# Patient Record
Sex: Male | Born: 1975 | Race: White | Hispanic: No | Marital: Married | Smoking: Never smoker
Health system: Southern US, Community
[De-identification: ages and names within clinical notes are randomized; demographics above are authoritative.]

## PROBLEM LIST (undated history)

## (undated) DIAGNOSIS — R7303 Prediabetes: Secondary | ICD-10-CM

## (undated) DIAGNOSIS — I4901 Ventricular fibrillation: Secondary | ICD-10-CM

## (undated) DIAGNOSIS — I469 Cardiac arrest, cause unspecified: Secondary | ICD-10-CM

## (undated) DIAGNOSIS — Z950 Presence of cardiac pacemaker: Secondary | ICD-10-CM

## (undated) DIAGNOSIS — J302 Other seasonal allergic rhinitis: Secondary | ICD-10-CM

## (undated) HISTORY — DX: Ventricular fibrillation: I49.01

## (undated) HISTORY — DX: Cardiac arrest, cause unspecified: I46.9

## (undated) HISTORY — DX: Other seasonal allergic rhinitis: J30.2

## (undated) HISTORY — PX: CARDIAC DEFIBRILLATOR PLACEMENT: SHX171

## (undated) HISTORY — DX: Prediabetes: R73.03

---

## 2007-09-18 ENCOUNTER — Ambulatory Visit: Payer: Self-pay | Admitting: Internal Medicine

## 2007-09-18 ENCOUNTER — Ambulatory Visit: Payer: Self-pay | Admitting: Cardiology

## 2007-09-18 ENCOUNTER — Inpatient Hospital Stay (HOSPITAL_COMMUNITY): Admission: EM | Admit: 2007-09-18 | Discharge: 2007-09-23 | Payer: Self-pay | Admitting: Emergency Medicine

## 2007-09-18 ENCOUNTER — Encounter: Payer: Self-pay | Admitting: Cardiology

## 2007-10-03 ENCOUNTER — Ambulatory Visit: Payer: Self-pay | Admitting: Internal Medicine

## 2008-01-26 ENCOUNTER — Ambulatory Visit: Payer: Self-pay | Admitting: Internal Medicine

## 2008-06-27 ENCOUNTER — Encounter: Payer: Self-pay | Admitting: Internal Medicine

## 2008-09-21 ENCOUNTER — Telehealth: Payer: Self-pay | Admitting: Internal Medicine

## 2008-10-06 DIAGNOSIS — J301 Allergic rhinitis due to pollen: Secondary | ICD-10-CM

## 2008-10-06 DIAGNOSIS — Z95810 Presence of automatic (implantable) cardiac defibrillator: Secondary | ICD-10-CM

## 2008-10-08 ENCOUNTER — Ambulatory Visit: Payer: Self-pay | Admitting: Internal Medicine

## 2008-10-31 ENCOUNTER — Telehealth: Payer: Self-pay | Admitting: Internal Medicine

## 2009-01-28 ENCOUNTER — Encounter (INDEPENDENT_AMBULATORY_CARE_PROVIDER_SITE_OTHER): Payer: Self-pay | Admitting: *Deleted

## 2009-08-16 ENCOUNTER — Ambulatory Visit: Payer: Self-pay | Admitting: Internal Medicine

## 2010-04-21 ENCOUNTER — Telehealth: Payer: Self-pay | Admitting: Internal Medicine

## 2010-05-20 ENCOUNTER — Encounter (INDEPENDENT_AMBULATORY_CARE_PROVIDER_SITE_OTHER): Payer: Self-pay | Admitting: *Deleted

## 2010-06-17 NOTE — Progress Notes (Signed)
Summary: refill  Phone Note Refill Request Message from:  Patient on April 21, 2010 1:09 PM  Refills Requested: Medication #1:  METOPROLOL TARTRATE 25 MG TABS 1 TAB TWICE DAILY [facility] [address]  Initial call taken by: Judie Grieve,  April 21, 2010 1:10 PM    Prescriptions: METOPROLOL TARTRATE 25 MG TABS (METOPROLOL TARTRATE) 1 TAB TWICE DAILY  #60 Each x 3   Entered by:   Laurance Flatten CMA   Authorized by:   Laren Boom, MD, [facility]   Signed by:   Laurance Flatten CMA on 04/22/2010   Method used:   Electronically to        [facility] [address] (retail)       [phone]       [place], [place]  [address]       Ph: [phone]       Fax: [phone]   RxID:   [id number]

## 2010-06-17 NOTE — Assessment & Plan Note (Signed)
Summary: ROV/ GD   Visit Type:  rov Primary Provider:  none  CC:  no heart complaints.  History of Present Illness: Mr. Roughton returns today for followup.  He is a 35 yo man with a rescucitated VF arrest, s/p ICD implantation who returns today for eval.  He has been stable.  He is exercising regularly with heart rates over 180/min.  He denies anginal symptoms, sob, or peripheral edema.  He's had no intercurrent ICD therapies. He does note some dizzy spells but has increased his caffiene intake.  Problems Prior to Update: 1)  Ventricular Fibrillation Arrest,hx of  (ICD-427.41) 2)  Allergic Rhinitis, Seasonal  (ICD-477.0) 3)  Implantation of Defibrillator, Hx of  (ICD-V45.02)  Current Medications (verified): 1)  Metoprolol Tartrate 25 Mg Tabs (Metoprolol Tartrate) .Marland Kitchen.. 1 Tab Twice Daily 2)  Fish Oil 1000 Mg Caps (Omega-3 Fatty Acids) .... 2 Caps Once Daily 3)  Aspirin 81 Mg Tabs (Aspirin) .... Take One Tablet By Mouth Once Daily 4)  Multivitamins  Tabs (Multiple Vitamin) .... Take By Mouth Once Daily  Allergies (verified): No Known Drug Allergies  Past History:  Past Medical History: Last updated: 10/06/2008 VENTRICULAR FIBRILLATION ARREST,HX OF (ICD-427.41) ALLERGIC RHINITIS, SEASONAL (ICD-477.0)     Past Surgical History: Last updated: 10/06/2008 IMPLANTATION OF DEFIBRILLATOR, HX OF (ICD-V45.02)  Review of Systems  The patient denies chest pain, syncope, dyspnea on exertion, and peripheral edema.    Vital Signs:  Patient profile:   35 year old male Height:      70 inches Weight:      177 pounds BMI:     25.49 Pulse rate:   59 / minute BP sitting:   121 / 75  (left arm) Cuff size:   large  Vitals Entered By: Oswald Hillock (August 16, 2009 8:54 AM)  Physical Exam  General:  Well developed, well nourished, in no acute distress. Head:  normocephalic and atraumatic Eyes:  PERRLA/EOM intact; conjunctiva and lids normal. Mouth:  Teeth, gums and palate  normal. Oral mucosa normal. Neck:  Neck supple, no JVD. No masses, thyromegaly or abnormal cervical nodes. Chest Wall:  Well healed ICD incision. Lungs:  Clear bilaterally to auscultation with no wheezes, rales, or rhonchi. Heart:  RRR with normal S1 and S2.  PMI is not enlarged or laterally displaced. Abdomen:  Bowel sounds positive; abdomen soft and non-tender without masses, organomegaly, or hernias noted. No hepatosplenomegaly. Msk:  Back normal, normal gait. Muscle strength and tone normal. Pulses:  pulses normal in all 4 extremities Extremities:  No clubbing or cyanosis.  Multiple tatoos. Neurologic:  Alert and oriented x 3.   EKG  Procedure date:  08/16/2009  Findings:      Sinus bradycardia with rate of:  59.   ICD Specifications Following MD:  Lewayne Bunting, MD     ICD Vendor:  [facility] Jude     ICD Model Number:  [id number]     ICD Serial Number:  [id number] ICD DOI:  09/22/2007     ICD Implanting MD:  Lewayne Bunting, MD  Lead 1:    Location: RV     DOI: 09/22/2007     Model #: 1308     Serial #: [id number]     Status: active  Indications::  CARDIAC ARREST   ICD Follow Up Remote Check?  No Battery Voltage:  3.20 V     Charge Time:  11.1 seconds     Battery Est. Longevity:  7.5 years Underlying rhythm:  SR  ICD Dependent:  No       ICD Device Measurements Right Ventricle:  Amplitude: 10.4 mV, Impedance: 410 ohms, Threshold: 1.25 V at 0.5 msec Shock Impedance: 79 ohms   Episodes Coumadin:  No Shock:  0     ATP:  0     Nonsustained:  0     Ventricular Pacing:  <1%  Brady Parameters Mode VVI     Lower Rate Limit:  40      Tachy Zones VF:  200     Next Cardiology Appt Due:  11/15/2009 Tech Comments:  RV output 2.5@0 .5.  There were 3 noise reversions noted all on the same date in July and with 1/2 hour time frame.  This just happened to be Mr. Erion anniversary and him and his wife were swimming in an indoor pool area with their son.  There was only noise noted no oversensing.   Device function normal.  He will be seen back in the clinic in 3 months. Altha Harm, LPN  August 17, 7423 9:15 AM   Impression & Recommendations:  Problem # 1:  VENTRICULAR FIBRILLATION ARREST,HX OF (ICD-427.41) His arrhythmias have been well controlled.  Will followup in one year. His updated medication list for this problem includes:    Metoprolol Tartrate 25 Mg Tabs (Metoprolol tartrate) .Marland Kitchen... 1 tab twice daily    Aspirin 81 Mg Tabs (Aspirin) .Marland Kitchen... Take one tablet by mouth once daily  Problem # 2:  IMPLANTATION OF DEFIBRILLATOR, HX OF (ICD-V45.02) His device is working normally today except for three noise reversions, all on the same day.  Patient Instructions: 1)  Your physician wants you to follow-up in: 12 months with Dr Ladona Ridgel.  You will receive a reminder letter in the mail two months in advance. If you don't receive a letter, please call our office to schedule the follow-up appointment. 2)  Your physician recommends that you schedule a follow-up appointment in: 3 months with device clinic.

## 2010-06-17 NOTE — Cardiovascular Report (Signed)
Summary: Office Visit   Office Visit   Imported By: Roderic Ovens 08/23/2009 14:01:57  _____________________________________________________________________  External Attachment:    Type:   Image     Comment:   External Document

## 2010-06-19 NOTE — Letter (Signed)
Summary: Device-Delinquent Check  [facility], Main Office  [address]   [place], [place] [address]   Phone: [phone]  Fax: [phone]     May 20, 2010 MRN: [id number]   [facility] [address] [place], [place]  [address]   Dear Mr. Matsumura,  According to our records, you have not had your implanted device checked in the recommended period of time.  We are unable to determine appropriate device function without checking your device on a regular basis.  Please call our office to schedule an appointment, with Dr Ladona Ridgel, as soon as possible.  If you are having your device checked by another physician, please call us so that we may update our records.  Thank you,  Letta Moynahan, EMT  May 20, 2010 1:01 PM  [facility]

## 2010-07-29 ENCOUNTER — Encounter: Payer: Self-pay | Admitting: *Deleted

## 2010-08-05 NOTE — Letter (Signed)
Summary: Device-Delinquent Phone Journalist, newspaper, Main Office  [address]   [place], [place] [address]   Phone: [phone]  Fax: [phone]     July 29, 2010 MRN: [id number]   [facility] [address] [place], [place]  [address]   Dear Brian Arellano,  According to our records, you were scheduled for a device phone transmission on 07-24-2010 .     We did not receive any results from this check.  If you transmitted on your scheduled day, please call us to help troubleshoot your system.  If you forgot to send your transmission, please send one upon receipt of this letter.  Thank you,   Architectural technologist Device Clinic

## 2010-08-12 ENCOUNTER — Encounter: Payer: Self-pay | Admitting: Internal Medicine

## 2010-08-21 ENCOUNTER — Ambulatory Visit (INDEPENDENT_AMBULATORY_CARE_PROVIDER_SITE_OTHER): Payer: PRIVATE HEALTH INSURANCE | Admitting: Internal Medicine

## 2010-08-21 ENCOUNTER — Encounter: Payer: Self-pay | Admitting: Internal Medicine

## 2010-08-21 DIAGNOSIS — Z95810 Presence of automatic (implantable) cardiac defibrillator: Secondary | ICD-10-CM

## 2010-08-21 DIAGNOSIS — I469 Cardiac arrest, cause unspecified: Secondary | ICD-10-CM

## 2010-08-21 NOTE — Assessment & Plan Note (Signed)
His device is working normally. At her longevity is approximately 7 years. He has had no recurrent arrhythmias. We'll recheck his device in several months.

## 2010-08-21 NOTE — Progress Notes (Signed)
HPI  Brian Arellano returns today for followup. He is a very well-appearing young man who is status post cardiac arrest. He underwent implantable defibrillator 3 years ago. It was thought that he had long QT syndrome. The patient continues to exercise regularly. He has had no recurrent defibrillator discharges. He denies chest pain or shortness of breath. He has had no trouble with his medications.  No Known Allergies   Current Outpatient Prescriptions  Medication Sig Dispense Refill  . aspirin 81 MG tablet Take 81 mg by mouth daily.        . fish oil-omega-3 fatty acids 1000 MG capsule Take 2 g by mouth daily.        . metoprolol tartrate (LOPRESSOR) 25 MG tablet Take 25 mg by mouth 2 (two) times daily.        . Multiple Vitamin (MULTI-VITAMIN PO) Take by mouth daily.           Past Medical History  Diagnosis Date  . Cardiac arrest - ventricular fibrillation   . Allergic rhinitis, seasonal     ROS:   All systems reviewed and negative except as noted in the HPI.   Past Surgical History  Procedure Date  . Cardiac defibrillator placement     ICD [employer] Jude     Family History  Problem Relation Age of Onset  . Coronary artery disease Father     cabg x2  . Heart disease      father's brother, bypass surgery  . Heart disease      father's brother, bypass surgery  . Coronary artery disease      family history     History   Social History  . Marital Status: Married    Spouse Name: N/A    Number of Children: N/A  . Years of Education: N/A   Occupational History  . Not on file.   Social History Main Topics  . Smoking status: Never Smoker   . Smokeless tobacco: Not on file  . Alcohol Use: Not on file  . Drug Use: Not on file  . Sexually Active: Not on file   Other Topics Concern  . Not on file   Social History Narrative  . No narrative on file     BP 120/80  Pulse 81  Ht 5\' 10"  (1.778 m)  Wt 184 lb (83.462 kg)  BMI 26.40 kg/m2  Physical Exam:  Well  appearing NAD HEENT: Unremarkable Neck:  No JVD, no thyromegally Lymphatics:  No adenopathy Back:  No CVA tenderness Lungs:  Clear. Well healed ICD incision. HEART:  Regular rate rhythm, no murmurs, no rubs, no clicks Abd:  Flat, positive bowel sounds, no organomegally, no rebound, no guarding Ext:  2 plus pulses, no edema, no cyanosis, no clubbing Skin:  No rashes no nodules Neuro:  CN II through XII intact, motor grossly intact  DEVICE  Normal device function.  See PaceArt for details.   Assess/Plan:

## 2010-08-21 NOTE — Patient Instructions (Addendum)
Your physician recommends that you schedule a follow-up appointment in: 3 months in device clinic  Your physician wants you to follow-up in: 12 months with Dr Court Joy will receive a reminder letter in the mail two months in advance. If you don't receive a letter, please call our office to schedule the follow-up appointment.

## 2010-09-30 NOTE — Cardiovascular Report (Signed)
Brian Arellano, Brian Arellano        ACCOUNT NO.:  [id number]   MEDICAL RECORD NO.:  [id number]          PATIENT TYPE:  INP   LOCATION:  2924                         FACILITY:  [facility]   PHYSICIAN:  Arturo Morton. Riley Kill, MD, FACCDATE OF BIRTH:  1976-04-17   DATE OF PROCEDURE:  DATE OF DISCHARGE:                            CARDIAC CATHETERIZATION   INDICATIONS:  The patient is a 35 year old who had suffered a cardiac  arrest.  He has no known prior coronary artery disease.  He has a strong  family history, but has very few risk factors other than that.  He was  cooled, and has recovered mental function.  The current study was done  to assess coronary anatomy as a pre-procedure movement to implantable  defibrillator insertion.   PROCEDURE:  1. Left heart catheterization  2. Selective coronary arteriography .  3. Selective left ventriculography.   DESCRIPTION OF PROCEDURE:  The patient was brought to the  catheterization laboratory and prepped and draped in usual fashion.  Through an anterior puncture, the femoral artery was entered.  A 5-  French sheath was placed.  Views of left and right coronary arteries  were then obtained in multiple angiographic projections.  I did not feel  it.  We saw the left anterior descending artery well enough, so the 5-  [place] sheath was upgraded to a 6-French sheath a JL3.5 guiding catheter  was then utilized to get maximum contrast into the coronary.  Following  this, central aortic and left ventricular pressures were measured with a  pigtail and ventriculography was done in the RAO projection.  Overall,  the patient tolerated procedure well and there were no complications, I  reviewed the findings with his wife.   HEMODYNAMIC DATA:  1. Central aortic pressure 116/67.  2. Left ventricular pressure 126/9.  3. There is no gradient pullback across aortic valve.   ANGIOGRAPHIC DATA:  1. Left main is free of critical disease.  2. The LAD courses to the  apex.  It divides distally into a large      diagonal and an apical LAD.  There is a smaller diagonal more      proximally.  After the first diagonal, there is an area of mild      eccentric plaquing of approximately 30% involving the LAD.  It does      not appear to be critical and does not appear to be flow-limiting.  3. The ramus intermedius is free of critical disease.  4. The AV circumflex is smooth and free of critical disease.  5. The right coronary artery is a moderately large vessel providing a      posterior descending and posterolateral system is free of critical      disease.  6. The ventriculogram demonstrates well preserved overall LV function      because the tachycardia was difficult to gauge overall EF but I      would grade it as 50%.   CONCLUSION:  1. Preserved overall left ventricular function.  2. Minor eccentric plaquing in the LAD.   DISPOSITION:  Electrophysiology will further evaluate the patient for  possible ICD placement.  He may also need MRI.      Arturo Morton. Riley Kill, MD, [facility]  Electronically Signed     TDS/MEDQ  D:  09/21/2007  T:  09/22/2007  Job:  [id number]

## 2010-09-30 NOTE — Assessment & Plan Note (Signed)
[facility]                         ELECTROPHYSIOLOGY OFFICE NOTE   NAME:Brian Arellano, Brian Arellano               MRN:          [id number]  DATE:01/26/2008                            DOB:          August 02, 1975    Brian Arellano returns today for followup.  He is a very pleasant young  man with a history of VF arrest status post successful resuscitation.  There was a question though he was not clearly diagnostic of long QT  syndrome as he had very prominent U waves post arrest.  Subsequent  evaluation demonstrated no obvious etiology for his cardiac arrest.  The  patient returns today for followup.  He has no palpitations.  She denies  any intercurrent IC therapies.  He does note some discomfort over his  ICD insertion site.   MEDICATIONS:  1. Metoprolol 25 mg twice a day.  2. Potassium 2 tablets daily.  3. Multiple vitamin.  4. Aspirin 81 mg a day.   PHYSICAL EXAMINATION:  GENERAL:  The patient is a pleasant very well-  appearing young man in no acute distress.  VITAL SIGNS:  Blood pressure today was 123/74.  The pulse was 68 and  regular.  Respirations were 18.  Weight was 185 pounds.  NECK:  No jugular distention.  LUNGS:  Clear bilaterally to auscultation.  No wheezes, rales, or  rhonchi are present.  CARDIOVASCULAR:  Regular rate and rhythm.  Normal S1 and S2.  ABDOMEN:  Soft, nontender.  EXTREMITIES:  No edema.   Interrogation of his defibrillator demonstrates 1 nonsustained episode  of VT which terminated spontaneously.  The R-waves were 7, the impedance  410, the threshold with volts of 0.5.  The battery voltage was 3.2  volts.   IMPRESSION:  1. Unexplained ventricular fibrillation arrest presumably secondary to      long QT syndrome.  2. Status post implantable cardioverter-defibrillator insertion.   DISCUSSION:  Brian Arellano is stable.  I would note that he has  complained of constipation and this occurred with potassium  supplementation.  To this end, I have asked that he decrease his  potassium from 2 tablets to 1 tablet a day.  I will plan to see him back  in several months.  He is to call if he has any intercurrent IC  therapies.  I should note that his ICD insertion site appeared be  healing normally, though he does have a very thin skin secondary to his  being very fit.    Doylene Canning. Ladona Ridgel, MD  Electronically Signed   GWT/MedQ  DD: 01/26/2008  DT: 01/27/2008  Job #: [id number]

## 2010-09-30 NOTE — Consult Note (Signed)
Brian Arellano, Brian Arellano        ACCOUNT NO.:  [id number]   MEDICAL RECORD NO.:  [id number]          PATIENT TYPE:  INP   LOCATION:  2909                         FACILITY:  [facility]   PHYSICIAN:  Learta Codding, MD,FACC DATE OF BIRTH:  [birth year]   DATE OF CONSULTATION:  09/18/2007  DATE OF DISCHARGE:                                 CONSULTATION   CURRENT COMPLAINTS:  72. 35 year old male status post sudden cardiac death.  2. (Ventricular fibrillation).   HISTORY OF PRESENT ILLNESS:  The patient is a 35 year old otherwise  healthy white male with no prior cardiac history.  The patient was  brought to the emergency room after cardiac arrest.  The history was  obtained from the patient's mother who was present at the time of the  patient's collapse.  Additional history was obtained from the fire  department and EMS as well as Dr. Ethelda Chick.  Next, the patient  apparently was in his usual state of health up until today.  He was at  his mother's house sitting on the couch.  His wife reportedly was out of  town for business and the mother recalls that she was talking about  church and when she turned around because she heard gurgling sounds, she  noticed that the patient's eyes suddenly rolled back and was  unresponsive.  She noticed some initial jerking activity.  She  immediately ran up to her son and tried to arouse him.  She noticed that  he was not breathing at this point anymore.  She did not check for  pulse.  She immediately called 911, who gave her instructions to  initiate CPR to the patient.  The patient's mother has had some formal  training in CPR.  She did chest compressions as well as mouth-to-mouth  resuscitation.  Reportedly, she did this within the first 1-2 minutes  after he collapsed.  The patient's mother performed CPR for  approximately 5-10 minutes, at which point, the fire department arrived  and an AED was applied.  The AED advised shock therapy and the rhythm  strips were reviewed in the ER demonstrating ventricular fibrillation.  The patient was then shocked into normal sinus rhythm; however, en route  after he was intubated by EMS, the patient required defibrillation x2.  The patient was also given epinephrine and started on epinephrine drip  upon arrival to the emergency room.  In the ER, the patient's vital  signs were stable with a blood pressure of 130/70.  His heart rate was  93 beats per minute and appeared to be normal sinus rhythm.  There was  some slight QTc prolongation, but no U waves were noted.  There were  change of early repolarization, but no definite infarct pattern.  The  patient was also immediately started on hypothermia protocol by critical  care medicine and he was sedated and paralyzed as part of the protocol.  The patient is currently unresponsive and is paralyzed and therefore a  neurological examination could not be performed.   ALLERGIES:  No known history of allergies.   MEDICATIONS:  None.  All the history was obtained by the mother.  PAST MEDICAL HISTORY:  Also essentially within normal limits.  The  patient's mother reports no prior surgeries.   SOCIAL HISTORY:  The patient lives in [place] with his wife.  He has  one child, who has autism.  The patient works for [employer].  He  is very active.  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  There is  no history of drug use or herbal supplements.   FAMILY HISTORY:  Notable for father with diabetes and history of  myocardial infarction, recently diagnosed with pancreatic cancer.  Mother is alive and healthy.  The patient has one brother who is alive  and well.  There is no other history of sudden cardiac death in the  family.   REVIEW OF SYSTEMS:  Unable to be obtained, but according to the mother  was essentially within normal limits prior to this particular event.   PHYSICAL EXAMINATION:  VITAL SIGNS:  Blood pressure is 130/70, heart  rate is  70-80 beats per minute, temperature is pending.  The patient is  on hypothermia protocol.  Respirations are 12 on mechanical ventilation.  GENERAL:  The patient is intubated and paralyzed.  EYE:  Constricted pupils, faintly light reactive.  No doll's eyes are  present.  ENT:  Oropharynx is clear.  A tracheal tube is in position.  Normal  dentition.  Nasopharynx within normal limits and oropharynx is without  exudate or erythema.  NECK:  Reveals supple neck with no masses and no thyromegaly.  RESPIRATORY:  Normal auscultation.  No crackles and no wheezes.  Normal  chest movement with the respirator.  CARDIOVASCULAR:  Normal auscultation with normal S1-S2 and no  pathological murmurs.  There is no edema.  Carotid arteries are normal  with normal carotid upstroke bilaterally.  The aorta is midline and  pulsatile.  Femoral pulses are 2+ and there are no bruits.  Pedal pulses  are within normal limits.  GI:  Abdomen is soft.  There is no definite hepatosplenomegaly, no  hernias are noted.  GU AND GYN:  Deferred.  LYMPHATIC:  There is no adenopathy in the neck, axilla, or groin.  MUSCULOSKELETAL:  Unable to be performed secondary to paralysis, but  there are no gross arthritic changes.  SKIN:  Appears to be within normal limits.  The patient is cool  secondary to hypothermia protocol.  NEURO:  The patient is paralyzed.  DTRs are not performed and sensory  exam is not performed.  Psychomotor exam is unable to be performed  secondary to paralysis.   LABORATORY WORK:  Potassium is 3.3.  Other laboratory work is currently  pending.  Initial troponin is less than 0.05.   A bedside echo demonstrated normal LV function with no definite  segmental wall motion abnormalities, but this was a very limited study.  An official echocardiogram is pending.   PROBLEM LIST:  1. Sudden cardiac death.      a.     Rule out cardiomyopathy.      b.     Rule out primary arrhythmogenic event.      c.     Rule  out long QT syndrome (unlikely).      d.     Rule out hypertrophic cardiomyopathy.  2. Hypokalemia.  3. Hypothermia protocol.   PLAN:  1. Rhythm strips were reviewed.  It appeared the patient had a primary      arrhythmogenic event with ventricular fibrillation.  There is no  clear evidence from the electrocardiogram the patient has a long      QTc syndrome nor his EKG is suggestive of a Brugada syndrome.      Preexciatation seems unlikely, but ARVD cannot be excluded.      Echocardiogram is pending to rule out hypertrophic cardiomyopathy,      but I suspect this is also unlikely.  2. We will obtain all the rhythm strips from both the AED and rhythm      strips from EMS as additional information for electrophysiology      service.  I anticipate the patient will need a defibrillator if he      has neurological recovery.  3. Clinical care as well as neurology will be seeing the patient.  I      anticipate that given the sequence of events that there is a      reasonable chance the patient may have a reasonable prognosis      neurologically.  This will be determined in the next 24 hours,      particularly after the hypothermia protocol has been discontinued,      but at this      point in time, I will defer from using antiarrhythmic drug therapy      as the patient has no further ectopic activity.  4. The patient will require cardiac catheterization, possible cardiac      MRI as part of his cardiac workup if his neurological prognosis      improves.      Learta Codding, MD,FACC  Electronically Signed     GED/MEDQ  D:  09/18/2007  T:  09/18/2007  Job:  [id number]   cc:   Doug Sou, M.D.

## 2010-09-30 NOTE — Discharge Summary (Signed)
NAMECHALMERS, Brian Arellano        ACCOUNT NO.:  [id number]   MEDICAL RECORD NO.:  [id number]          PATIENT TYPE:  INP   LOCATION:  2924                         FACILITY:  [facility]   PHYSICIAN:  Doylene Canning. Ladona Ridgel, MD    DATE OF BIRTH:  28-Mar-1976   DATE OF ADMISSION:  09/18/2007  DATE OF DISCHARGE:  09/23/2007                               DISCHARGE SUMMARY   This patient has no known drug allergies.  This patient has no primary  caregiver at the present time.   FINAL DIAGNOSES:  1. Admitted with out-of-hospital ventricular fibrillation arrest.      a.     Emergency medical services shock x3.  2. Immediate hypothermia protocol started in the emergency room.      a.     Hypokalemia with potassium 3.0 on arrival.      b.     Emergency medical services strip show QT of 360 after sinus       rhythm restarted.  3. Mild encephalopathy after extubation from hypothermia, now      resolved.  4. A 2-D echocardiogram Sep 18, 2007, ejection fraction 40%-50%.  5. Left heart catheterization Sep 21, 2007, the ejection fraction is      50%.  There was a finding of very mild 30% segmental plaque in the      proximal mid left anterior descending, otherwise the coronaries are      angiographically normal.  6. Electrophysiology study Sep 22, 2007, with flecainide challenge, no      inducible ventricular tachycardia (a possible occult long QT      operating in this patient).  7. Patient discharging day one status post implant of a St. Jude      single-chamber cardioverter defibrillator, the model is a St. Jude      Current VR RF, defibrillator threshold study less than or equal to      25 joules.  8. A strong family history of coronary artery disease.  9. The patient's family will need to undergo genetic testing.  The      patient and family are particularly concerned regarding their son.      The father of this patient has had bypass surgery, 2 uncles have      had bypass surgery on the father's side,  and 1 uncle on the      father's side has had a stroke.  On the mother's side, the history      is not immediately available to me.   SECONDARY DIAGNOSES:  The patient has seasonal allergies.  The patient  has no surgeries.  He denies diabetes, hypertension, previous myocardial  infarction, previous stroke, no chronic obstructive pulmonary disease,  no renal insufficiency, no thyroid disease.   BRIEF HISTORY:  Brian Arellano is a 35 year old male transported to the  [facility] after out-of-hospital cardiac arrest.  He was with  his mother at that time sitting on the couch when suddenly his head  eased back on the couch, his eyes rolled back, he made a gurgling  sound, she called 911 and then started CPR.  This persisted about 5-10  minutes before the emergency medical services came.  They provided 1  shock and then on transport 2 more shocks.  The patient was intubated as  well.  The patient had some spontaneous respirations on arrival at [facility].   HOSPITAL COURSE:  The patient presents through the emergency room Sep 18, 2007, to [facility] with an out-of-hospital cardiac arrest.  Immediate  hypothermia protocol was instituted.  The patient was successfully  extubated after a point in time for protocol.  He was seen by  electrophysiology in consult as well as cardiology, and the plan was to  submit the patient to left heart catheterization and then  electrophysiology study and implanted cardioverter defibrillator.  In  addition, the patient had a cardiac MRI.  The patient is discharging day  1 after implant of the cardioverter defibrillator.   PROCEDURES THIS ADMISSION:  1. The hypothymia protocol.  2. A 2-D echocardiogram, Sep 18, 2007, ejection fraction at that time      was 40%-50%.  3. Left heart catheterization, Sep 21, 2007, ejection fraction of 50%      with segmental plaque in the proximal to mid LAD, otherwise the      coronaries are normal.  4. A cardiac MRI.   The study showed that there is no evidence of RV      dysplasia.  Normal left ventricle with no evidence of hyper      enhancement, ejection fraction of 71%.  No ASD or VSD.  No      pericardial effusion.  Cavity size on the right ventricle are in      function and normal.  There are considerable trabeculations      present.  No evidence of fatty infiltration.  Gadolinium-enhanced      imaging shows no evidence of scar tissue or infiltration.  5. An electrophysiology study with flecainide challenge.  No inducible      VT.  6. Implant of the single-chamber St. Jude Current VR RF cardioverter      defibrillator.  Defibrillator threshold study less than or equal to      105 joules.  Chest x-ray has been taken after the procedure and      shows that the lead is in appropriate position.  Also, the device      has been interrogated and all values are within normal limits.  The      patient was asked to keep his incision dry for the next 7-8 days      and to sponge bathe until Thursday, Sep 29, 2007.  The patient was      asked not to drive for next 6 months.  The patient may return to      work on Tuesday, Oct 11, 2007, for light duty until mid June.  The      patient has follow up at [facility] at [address] [facility] Monday, Oct 03, 2007, at 10:15, and he will      see Dr. Ladona Ridgel at 10:30.  The patient and his family will need to      participate in genetic testing scenario, office visit #2 to see Dr.      Ladona Ridgel Tuesday January 10, 2008, at 3:30.   MEDICATIONS AT DISCHARGE:  1. The patient was given a prescription for Zofran.  He was slightly      nauseated on postprocedure day #1, but anxious to  get home for his      son's birthday today.  2. Metoprolol 25 mg twice daily.  3. Enteric-coated aspirin 81 mg daily.   LABORATORY STUDIES THIS ADMISSION:  Complete blood count on Sep 22, 2007,  hemoglobin 13.2, hematocrit 37.7, white cells 9.1, and platelets of   132,000.  This was post cathed.  Complete blood count, his serum  electrolytes on Sep 22, 2007, 139 sodium, potassium 3.6, chloride 103,  carbonate 26, BUN is 9, creatinine 0.92, and glucose 95.  His protime  this admission was 16.7, INR 1.3, alkaline phosphatase this admission  58, SGOT is 76, and SGPT is 103, this was on Sep 19, 2007.  Troponin I  studies this admission 1.14 and 2.09.  The BNP on Sep 19, 2007, was 351,  TSH this admission was 4.301, T3 167.2 which is within normal limits,  and T4 1.49 within normal limits.   I would also urge the patient to take over-the-counter potassium  gluconate pills 2 daily as his potassium was low on this admission and  the patient does work out vigorously and so he works out vigorously for  his health but at his job.     Maple Mirza, PA      Doylene Canning. Ladona Ridgel, MD  Electronically Signed   GM/MEDQ  D:  09/23/2007  T:  09/24/2007  Job:  [id number]   cc:   Doylene Canning. Ladona Ridgel, MD

## 2010-09-30 NOTE — Assessment & Plan Note (Signed)
[facility]                         ELECTROPHYSIOLOGY OFFICE NOTE   NAME:Brian Arellano               MRN:          [id number]  DATE:10/03/2007                            DOB:          1976/01/19    Brian Arellano returns today for followup.  He is a very pleasant 35-year-  old male with a history of resuscitated VF arrest of unknown etiology  who returns today for followup.  He is status post ICD insertion.  He  denies chest pain or any intercurrent IC therapies and otherwise feels  well.  He has multiple questions about returning to work and is  concerned about not knowing the etiology of his VF arrest.   MEDICATIONS:  1. Metoprolol 25 twice a day.  2. Multiple vitamins daily.  3. Aspirin 81 mg daily.   EXAM:  He is a pleasant well-appearing young man in no distress.  Blood pressure was 100/68, the pulse 65 and regular.  The respirations  were 18.  NECK:  No jugular venous distention.  There is no thyromegaly.  The  trachea is midline.  LUNGS:  Clear bilaterally auscultation.  No wheezes, rales or rhonchi  are present.  CARDIOVASCULAR:  Regular rate and rhythm.  Normal S1-S2.  EXTREMITIES:  No cyanosis, clubbing, or edema.  His ICD insertion site was healed nicely.   His EKG demonstrates sinus rhythm with sinus arrhythmia.  There is a  prominent U wave but no definitive diagnostic evidence for long QT  syndrome.   IMPRESSION:  1. Unexplained ventricular fibrillation arrest with successful      resuscitation and no significant residual encephalopathy.  2. Status post ICD insertion.   DISCUSSION:  Overall, Brian Arellano is stable.  His defibrillator is  working normally.  We will plan on obtaining familial genetic testing to  try to better understand what the mechanism of his ventricular  fibrillation arrest was.     Doylene Canning. Ladona Ridgel, MD  Electronically Signed    GWT/MedQ  DD: 10/03/2007  DT: 10/03/2007  Job #: [id number]

## 2010-09-30 NOTE — Letter (Signed)
Oct 03, 2007    [facility]   RE:  AMANDO, CHAPUT  MRN:  [id number]  /  DOB:  [birth year]   To Whom It May Concern:   I am writing on behalf of my patient Rhyker Silversmith, whose date of  birth is [birth year], to request predetermination/preauthorization  for CPT4 codes 40981, [phone], [phone], [phone], and [phone] with a diagnosis  code of resuscitated VF arrest 427.5.  Based on my evaluation and review  of the available literature, I believe that the Familion genetic test,  which is offered by [facility], is warranted and medically necessary for  Mr. Oma Alpert.  There is no less expensive alternative  available to obtain this urgently needed information.  The patient is a  35 year old man who recently sustained a ventricular fibrillation arrest  for which there was no obvious etiology.  At the time of his initial  presentation his QT interval was prolonged, suggesting prolongation of  the QT syndrome, but we do not have any specific information, and, in  fact, his current QT is not prolonged.  Subsequent evaluation ruled out  arrhythmogenic right ventricular dysplasia as a cause, also coronary  disease and myocarditis were ruled out as were other cardiomyopathies.  At this point, we are trying to understand what the mechanism of the  patient's cardiac arrest was.  We know he had ventricular fibrillation,  this was documented, but the underlying reason for this is still  unclear.  This, of course, will be very important as he has family  members who would also require screening and his diagnosis will lead to  more straightforward evaluation of his next-of-kin.  It would also be of  critical importance in knowing how best to treat the patient in the  future.  As you know, long QT syndrome and Brugada's syndrome are the  most common cardiac channelopathies affecting approximately 1 in 3000  patients.  Several hundred new carriers are born each year.  These  syndromes can  cause cardiac arrhythmias and sudden cardiac death in  otherwise healthy individuals.  For patients who are potential carriers  of one of the inherited cardiac channelopathies, the Familion test  allows physicians to rule in definitively the presence of a long QT  syndrome or Brugada's syndrome.  This will help in better recommending  appropriate treatment for the current patient.  Please do not hesitate  to contact me for any additional questions regarding the Familion  genetic test for my patient Mr. Isidore Margraf.    Sincerely,      Doylene Canning. Ladona Ridgel, MD  Electronically Signed    GWT/MedQ  DD: 10/03/2007  DT: 10/03/2007  Job #: [id number]

## 2010-09-30 NOTE — Discharge Summary (Signed)
Brian Arellano, Brian Arellano        ACCOUNT NO.:  [id number]   MEDICAL RECORD NO.:  [id number]          PATIENT TYPE:  INP   LOCATION:  2924                         FACILITY:  [facility]   PHYSICIAN:  Coralyn Helling, MD        DATE OF BIRTH:  1975/08/02   DATE OF ADMISSION:  09/18/2007  DATE OF DISCHARGE:  09/23/2007                               DISCHARGE SUMMARY   DISCHARGE DIAGNOSES:  1. Status post vent-dependent respiratory failure from cardiac arrest      witnessed, from ventricular fibrillation to ventricular      tachycardia.  2. Hypophosphatemia.   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 Chest compressions were begun immediately. When  the fire department responded with an AED he was found to be in V-fib  and defibrillation was done. CPR was continued, and he was intubated in  the field by emergency medical service. He was transferred to [facility] in the emergency department, at which time he was placed on  hyperthermia protocol and central line was placed per the CCM service  and he was taken on to the CCM service at that time.   LINES AND TUBES:  He had an endotracheal tube from May 3 to May 5. He  had a left IJ central venous catheter from May 3 to May 8. He had a  right femoral A-line from May 3 to May 5.   PROTOCOL:  He was on sedation protocol from May 3 to May 5. He was on  hypothermia protocol from May 3 to May 5, at which time he was rewarmed  and it was taken off.   For stress ulcer and DVT prophylaxis he was placed on proton pump  inhibitors and initial IV heparin which he had been taken off.   PROCEDURES:  He had cardiac catheterization on May 6 by Dr. Shawnie Pons which showed ejection fraction of 50%, and on May 7 he had an  AICD implantation by Dr. Sherryl Manges. He had an MRI cardiac morphology  performed on May 7 which showed  an ejection fraction of 71% and normal  ventricles.   LABORATORY DATA:  Arterial blood gas:  The pH was 7.51, PCO2 of 22, PO2  of 157, and 40% FIO2. Hemoglobin 13.2, hematocrit 37.7, WBC 9.1,  platelets 132, sodium 139, potassium 3.6, chloride 103, CO2 26, BUN 9,  creatinine 0.92, glucose 95, INR 1.3, PTT is 43. Troponin-I 2.09, CK2 is  864, CK/MB is 64.6, AST is 76, and ALT is 103. Alkaline-phosphatase 58,  total bilirubin 0.9, albumin 4.3, calcium 8.8, magnesium 2.2, phosphorus  2.6. Drug screen of abuse was negative except for benzodiazepines which  were administered in the field. TSH is 4.3.   HOSPITAL COURSE BY DISCHARGE DIAGNOSES:  Vent-dependent respiratory  failure secondary to cardiac arrest witnessed. He was in her mother's  home at which time he was witnessed to have arrest. He underwent shock  delivered by AED per the fire department, and CPR was continued.  He was  intubated in the field and transferred to [facility] where he  was placed on the hypothermia protocol in the emergency department. He  was cool for approximately 48 hours at which time he was rewarmed and  removed from cooling blankets and extubated without difficulty. After  that, he underwent complete cardiac evaluation which consisted of a  cardiac catheterization per Dr. Riley Kill which demonstrated 30% segmental  plaque of the LAD with an LV ejection fraction of 50%. He also underwent  AID implantation by Dr. Sherryl Manges on May 7 to resolve further V-fib  or VT interventions. He also underwent MRI of the cardiac morphology  which was essentially negative. He reached maximal hospital benefit by  Sep 23, 2007, and is ready for discharge home.   DISCHARGE MEDICATIONS:  1. Zofran 8 mg as needed for nausea.  2. Metoprolol 25 mg 1 tablet a.m., 1 tablet p.m.  3. Aspirin 81 mg daily.   He is to follow up with [facility] for ICD  interrogation on May 18th at 10:15 a.m. and to follow  up with Dr. Sharrell Ku at 10:30 a.m. He has also been placed on potassium gluconate 2  tablets daily. His diet is a low-sodium, heart-healthy diet.   SPECIAL INSTRUCTIONS:  He is to keep the IAD site warm and dry.   DISPOSITION/CONDITION AT DISCHARGE:  Improved. He is being discharged  home without neurological defects and improved condition.      Devra Dopp, MSN, ACNP      Coralyn Helling, MD  Electronically Signed    SM/MEDQ  D:  09/23/2007  T:  09/23/2007  Job:  [id number]   cc:   Duke Salvia, MD, [facility]  Arturo Morton. Riley Kill, MD, [facility]

## 2010-09-30 NOTE — Op Note (Signed)
Brian Arellano, Brian Arellano        ACCOUNT NO.:  [id number]   MEDICAL RECORD NO.:  [id number]          PATIENT TYPE:  INP   LOCATION:  2924                         FACILITY:  [facility]   PHYSICIAN:  Doylene Canning. Ladona Ridgel, MD    DATE OF BIRTH:  [birth year]   DATE OF PROCEDURE:  09/22/2007  DATE OF DISCHARGE:                               OPERATIVE REPORT   PROCEDURE PERFORMED:  Electrophysiology study followed by  ICD  implantation.   HISTORY:  The patient is a 35 year old man who presented to the hospital  with resuscitated ventricular fibrillation arrest.  He had been in his  usual state of health having had no syncope or any other symptoms of  cardiac problems in the past.  When he passed out and was unresponsive,  his mother started CPR.  The paramedics arrived and he was defibrillated  from ventricular fibrillation restoring sinus rhythm.  The patient had  initial encephalopathy but then improved and was extubated.  Catheterization demonstrated no coronary disease and basically normal LV  function.  His EF was approximately 50%.  His QT interval was on the  long side, though not diagnostically long for long QT syndrome.  His  signal-averaged EKG was negative.  An MRI was obtained, which is pending  at the time of this dictation.  He is now referred for additional  evaluation.   PROCEDURE:  After informed consent was obtained, the patient was taken  to the diagnostic EP lab in a fasting state.  After usual preparation  and draping, intravenous fentanyl and midazolam was given for sedation.  A 5-French quadripolar catheter was inserted percutaneously into the  right jugular vein and advanced to the right ventricle.  A 5-French  quadripolar catheter was inserted percutaneously in the right femoral  vein, advanced to His-bundle region.  After measuring the basic  intervals, rapid ventricular pacing was carried out from the RV apex as  well as the RV outflow tract at base drive cycle length of  098 msec and  stepwise decreased down to 260 msec.  There is no inducible VT.  At  baseline, VA dissociation was present at 600 msec.  This effectively  excluded an accessory pathway.  Next, programmed ventricular stimulation  was carried out from the RV apex and RV outflow tract at base drive  cycle lengths of 119 and 400 msec.  S1-S2, S1, S2-S3, and S1-S2, S3-S4  stimuli were delivered with the S1-S2, S2-S3, and S3-S4 interval  stepwise decreased down to ventricular refractoriness.  During  programmed ventricular stimulation, there was no inducible VT.  Finally,  mapping and timing of the activation between the RV, His-bundle region,  the RV apex, and the RV outflow tract was carried out.  The ventricular  timing delay was less than 50 msec in all areas.  At this point, the  mapping catheter was maneuvered into the right atrium and rapid atrial  pacing was carried out from the right atrium pace cycle length of 670  msec and stepwise decreased down to 340 msec where AV Wenckebach was  observed.  During rapid atrial pacing, the PR interval was greater than  the RR interval but there was no echo beats or no inducible SVT.  Next,  programmed atrial stimulation was carried out from the right atrium at  base drive cycle length of 161 msec.  The S1-S2 interval stepwise  decreased from 440 msec down to 230 msec where the AV node ERP was  observed.  During programmed atrial stimulation, there are no AH jumps,  no echo beats, no inducible SVT.  At this point, the catheters were  removed and the patient was prepped for ICD implantation secondary to  his resuscitated VF arrest with no reversible cause.   After usual preparation and draping, intravenous fentanyl and midazolam  was given again for sedation.  A 30 mL of lidocaine was infiltrated in  left infraclavicular region.  A 7 cm incision was carried out over this  region.  Electrocautery was utilized to dissect down the fascial plane.  The  left subclavian vein was subsequently punctured and the St. Jude  Lantana, model 7122, active fixation defibrillation lead serial #[id number]  [id number] was advanced into the right ventricle.  Mapping was carried out at  the final site on the RV septum.  The R waves were measured at 19 mV and  the pacing impedance was 727 ohms.  The threshold was 0.7 volts at 0.5  msec.  Ten volt pacing in this location did not stimulate the diaphragm.  With the leads in satisfactory position, it was secured to the  subpectoralis fascia with figure-of-eight silk suture.  The sewing  sleeve was also secured with silk suture.  Electrocautery was then  utilized to make a subcutaneous pocket.  Kanamycin irrigation was  utilized to irrigate the pocket.  Electrocautery was utilized to assure  hemostasis.  The St. Jude current VRRF single chamber defibrillator,  serial [id number], was connected to the defibrillation lead and placed back  in the subcutaneous pocket.  The generator secured with silk suture.  At  this point, the patient was prepped for defibrillation threshold  testing.   After the patient was more deeply sedated with fentanyl and Versed, VF  was induced with a T-wave shock.  A 15 J shock was then delivered, which  failed to terminate VF.  A 25 J shock was then delivered, which did in  fact terminate VF though it was a dirty break.  Five minutes was  allowed to elapse.  The pulse width was decreased from 7-1/2 to 5 on the  initial slope and the patient again had VF induced with the fibrillator  mode and a 25 J shock was delivered, which did terminate VF and restored  sinus rhythm.  At this point, no additional defibrillation threshold  testing was carried out and the incision was closed with a layer of 2-0  Vicryl followed by layer of 3-0 Vicryl.  Benzoin is painted on the skin.  Steri-Strips were applied.  A pressure dressing was placed.  The patient  was returned to his room in satisfactory condition.    COMPLICATIONS:  There were no immediate ECG complications.   RESULTS:  Demonstrate successful implantation of a single chamber  defibrillator after an invasive EP study demonstrated no inducible VT or  VF in a patient with resuscitated VF arrest.      Doylene Canning. Ladona Ridgel, MD  Electronically Signed     GWT/MEDQ  D:  09/22/2007  T:  09/23/2007  Job:  [id number]

## 2010-11-20 ENCOUNTER — Encounter: Payer: PRIVATE HEALTH INSURANCE | Admitting: *Deleted

## 2011-02-26 ENCOUNTER — Other Ambulatory Visit: Payer: Self-pay | Admitting: Internal Medicine

## 2011-06-25 ENCOUNTER — Encounter: Payer: Self-pay | Admitting: *Deleted

## 2011-09-02 ENCOUNTER — Telehealth: Payer: Self-pay | Admitting: Internal Medicine

## 2011-09-02 NOTE — Telephone Encounter (Signed)
09-02-11 lmm @ 1220pm for pt to call to set up defib ck with dr taylor due April, past due for device check/mt

## 2011-10-09 ENCOUNTER — Other Ambulatory Visit: Payer: Self-pay | Admitting: Internal Medicine

## 2011-10-09 MED ORDER — METOPROLOL TARTRATE 25 MG PO TABS: 25.0000 mg | ORAL_TABLET | Freq: Two times a day (BID) | ORAL | Status: DC

## 2012-01-22 ENCOUNTER — Ambulatory Visit (INDEPENDENT_AMBULATORY_CARE_PROVIDER_SITE_OTHER): Payer: PRIVATE HEALTH INSURANCE | Admitting: Internal Medicine

## 2012-01-22 ENCOUNTER — Encounter: Payer: Self-pay | Admitting: Internal Medicine

## 2012-01-22 VITALS — BP 135/87 | HR 69 | Ht 70.0 in | Wt 192.1 lb

## 2012-01-22 DIAGNOSIS — I469 Cardiac arrest, cause unspecified: Secondary | ICD-10-CM | POA: Insufficient documentation

## 2012-01-22 DIAGNOSIS — I10 Essential (primary) hypertension: Secondary | ICD-10-CM

## 2012-01-22 DIAGNOSIS — Z95810 Presence of automatic (implantable) cardiac defibrillator: Secondary | ICD-10-CM

## 2012-01-22 LAB — ICD DEVICE OBSERVATION
BATTERY VOLTAGE: 2.993 V
DEV-0020ICD: NEGATIVE
DEVICE MODEL ICD: 549569
HV IMPEDENCE: 80 Ohm
RV LEAD IMPEDENCE ICD: 375 Ohm
RV LEAD THRESHOLD: 1.25 V
TOT-0009: 1
TOT-0010: 21

## 2012-01-22 NOTE — Assessment & Plan Note (Signed)
His blood pressure is minimally elevated today. He will continue his beta blocker and maintain a low-sodium diet.

## 2012-01-22 NOTE — Assessment & Plan Note (Signed)
His device is working normally. He is approximately 5 years of battery longevity. We'll see him back in several months.

## 2012-01-22 NOTE — Progress Notes (Signed)
HPI Mr. Bordonaro returns today for followup. He is a very pleasant 36 year old man with a history of resuscitated ventricular fibrillation arrest, and hypertension. He is status post ICD implantation. In the last year, he has done well. He denies chest pain or shortness of breath. No syncope. No ICD discharges. No Known Allergies   Current Outpatient Prescriptions  Medication Sig Dispense Refill  . aspirin 81 MG tablet Take 81 mg by mouth daily.        . fish oil-omega-3 fatty acids 1000 MG capsule Take 2 g by mouth daily.        . metoprolol tartrate (LOPRESSOR) 25 MG tablet Take 1 tablet (25 mg total) by mouth 2 (two) times daily.  60 tablet  3  . Multiple Vitamin (MULTI-VITAMIN PO) Take by mouth daily.           Past Medical History  Diagnosis Date  . Cardiac arrest - ventricular fibrillation   . Allergic rhinitis, seasonal     ROS:   All systems reviewed and negative except as noted in the HPI.   Past Surgical History  Procedure Date  . Cardiac defibrillator placement     ICD St Jude     Family History  Problem Relation Age of Onset  . Coronary artery disease Father     cabg x2  . Heart disease      father's brother, bypass surgery  . Heart disease      father's brother, bypass surgery  . Coronary artery disease      family history     History   Social History  . Marital Status: Married    Spouse Name: N/A    Number of Children: N/A  . Years of Education: N/A   Occupational History  . Not on file.   Social History Main Topics  . Smoking status: Never Smoker   . Smokeless tobacco: Never Used  . Alcohol Use: No  . Drug Use: No  . Sexually Active: Not on file   Other Topics Concern  . Not on file   Social History Narrative  . No narrative on file     BP 135/87  Pulse 69  Ht 5\' 10"  (1.778 m)  Wt 192 lb 1.9 oz (87.145 kg)  BMI 27.57 kg/m2  Physical Exam:  Well appearing 36 year old man, NAD HEENT: Unremarkable Neck:  No JVD, no  thyromegally Lungs:  Clear with no wheezes, rales, or rhonchi. HEART:  Regular rate rhythm, no murmurs, no rubs, no clicks Abd:  soft, positive bowel sounds, no organomegally, no rebound, no guarding Ext:  2 plus pulses, no edema, no cyanosis, no clubbing Skin:  No rashes no nodules Neuro:  CN II through XII intact, motor grossly intact  DEVICE  Normal device function.  See PaceArt for details.   Assess/Plan:

## 2012-01-22 NOTE — Patient Instructions (Signed)
Your physician wants you to follow-up in: 3 months with device clinic and 12 months with Dr Taylor You will receive a reminder letter in the mail two months in advance. If you don't receive a letter, please call our office to schedule the follow-up appointment.  

## 2012-05-26 ENCOUNTER — Encounter: Payer: Self-pay | Admitting: *Deleted

## 2012-08-22 ENCOUNTER — Telehealth: Payer: Self-pay | Admitting: Internal Medicine

## 2012-08-22 NOTE — Telephone Encounter (Addendum)
08-22-12 lmm @224pm  pt's cell, called home number was pt's wife, told me to call pt cell, lmm to have pt call to set up past due defib ck with brooke/mt 09-16-12 no response from phone call, will send past due letter/mt

## 2012-09-16 ENCOUNTER — Encounter: Payer: Self-pay | Admitting: Internal Medicine

## 2012-10-13 ENCOUNTER — Encounter: Payer: Self-pay | Admitting: Internal Medicine

## 2012-11-04 ENCOUNTER — Other Ambulatory Visit: Payer: Self-pay | Admitting: Internal Medicine

## 2012-11-14 ENCOUNTER — Telehealth: Payer: Self-pay | Admitting: Internal Medicine

## 2012-11-14 NOTE — Telephone Encounter (Signed)
10-13-12 sent past due letter for device ck  /mt 11-14-12 no response to certified letter, due to see Ladona Ridgel in September/mt

## 2012-12-20 ENCOUNTER — Other Ambulatory Visit: Payer: Self-pay | Admitting: *Deleted

## 2012-12-20 MED ORDER — METOPROLOL TARTRATE 25 MG PO TABS: ORAL_TABLET | ORAL | Status: DC

## 2013-02-14 ENCOUNTER — Ambulatory Visit (INDEPENDENT_AMBULATORY_CARE_PROVIDER_SITE_OTHER): Payer: 59 | Admitting: Internal Medicine

## 2013-02-14 ENCOUNTER — Encounter: Payer: Self-pay | Admitting: Internal Medicine

## 2013-02-14 VITALS — BP 130/82 | HR 65 | Ht 70.0 in | Wt 175.0 lb

## 2013-02-14 DIAGNOSIS — Z95810 Presence of automatic (implantable) cardiac defibrillator: Secondary | ICD-10-CM

## 2013-02-14 DIAGNOSIS — I469 Cardiac arrest, cause unspecified: Secondary | ICD-10-CM

## 2013-02-14 LAB — ICD DEVICE OBSERVATION
BATTERY VOLTAGE: 2.68 V
DEV-0020ICD: NEGATIVE
DEVICE MODEL ICD: 549569
HV IMPEDENCE: 80 Ohm
PACEART VT: 0
RV LEAD AMPLITUDE: 8.6 mv
RV LEAD IMPEDENCE ICD: 387.5 Ohm
RV LEAD THRESHOLD: 1 V
TOT-0007: 5
TOT-0008: 0
TOT-0009: 1
TOT-0010: 28

## 2013-02-14 MED ORDER — METOPROLOL TARTRATE 25 MG PO TABS: ORAL_TABLET | ORAL | Status: DC

## 2013-02-14 NOTE — Patient Instructions (Addendum)

## 2013-02-14 NOTE — Progress Notes (Signed)
HPI [name] returns today for followup. He is a very pleasant 37 year old man with a history of resuscitated ventricular fibrillation arrest, and hypertension. He is status post ICD implantation. In the last year, he has done well. He denies chest pain or shortness of breath. No syncope. No ICD discharges. No Known Allergies   Current Outpatient Prescriptions  Medication Sig Dispense Refill  . aspirin 81 MG tablet Take 81 mg by mouth daily.        . fish oil-omega-3 fatty acids 1000 MG capsule Take 2 g by mouth daily.        . metoprolol tartrate (LOPRESSOR) 25 MG tablet TAKE ONE TABLET BY MOUTH TWICE DAILY  60 tablet  0  . Multiple Vitamin (MULTI-VITAMIN PO) Take by mouth daily.         No current facility-administered medications for this visit.     Past Medical History  Diagnosis Date  . Cardiac arrest - ventricular fibrillation   . Allergic rhinitis, seasonal   . Cardiac arrest     ROS:   All systems reviewed and negative except as noted in the HPI.   Past Surgical History  Procedure Laterality Date  . Cardiac defibrillator placement      ICD St Jude     Family History  Problem Relation Age of Onset  . Coronary artery disease Father     cabg x2  . Heart disease      father's brother, bypass surgery  . Heart disease      father's brother, bypass surgery  . Coronary artery disease      family history     History   Social History  . Marital Status: Married    Spouse Name: N/A    Number of Children: N/A  . Years of Education: N/A   Occupational History  . Not on file.   Social History Main Topics  . Smoking status: Never Smoker   . Smokeless tobacco: Never Used  . Alcohol Use: No  . Drug Use: No  . Sexual Activity: Not on file   Other Topics Concern  . Not on file   Social History Narrative  . No narrative on file    Blood pressure 130/82, pulse 65, height 5 foot 10 inches, weight 175 pounds, body mass index 25.1  Physical Exam:  Well  appearing 37 year old man, NAD HEENT: Unremarkable Neck:  No JVD, no thyromegally Lungs:  Clear with no wheezes, rales, or rhonchi. HEART:  Regular rate rhythm, no murmurs, no rubs, no clicks. Well-healed ICD incision Abd:  soft, positive bowel sounds, no organomegally, no rebound, no guarding Ext:  2 plus pulses, no edema, no cyanosis, no clubbing Skin:  No rashes no nodules Neuro:  CN II through XII intact, motor grossly intact  DEVICE  Normal device function.  See PaceArt for details.   Assess/Plan:

## 2013-02-14 NOTE — Assessment & Plan Note (Signed)
hhis St. Jude ICD is working normally. We'll plan to recheck in several months. 

## 2013-02-14 NOTE — Assessment & Plan Note (Signed)
He has had no recurrent episodes of ventricular fibrillation or ventricular tachycardia. He will continue his beta blocker.

## 2013-02-17 ENCOUNTER — Encounter: Payer: Self-pay | Admitting: Internal Medicine

## 2013-05-19 ENCOUNTER — Ambulatory Visit (INDEPENDENT_AMBULATORY_CARE_PROVIDER_SITE_OTHER): Payer: PRIVATE HEALTH INSURANCE | Admitting: *Deleted

## 2013-05-19 DIAGNOSIS — I469 Cardiac arrest, cause unspecified: Secondary | ICD-10-CM

## 2013-05-19 DIAGNOSIS — I428 Other cardiomyopathies: Secondary | ICD-10-CM

## 2013-05-21 LAB — MDC_IDC_ENUM_SESS_TYPE_REMOTE
Battery Remaining Longevity: 46 mo
Battery Voltage: 2.66 V
Brady Statistic RV Percent Paced: 1 %
Date Time Interrogation Session: 20150102070015
HIGH POWER IMPEDANCE MEASURED VALUE: 72 Ohm
HighPow Impedance: 72 Ohm
Lead Channel Pacing Threshold Amplitude: 1 V
Lead Channel Setting Pacing Amplitude: 2.5 V
Lead Channel Setting Sensing Sensitivity: 0.3 mV
MDC IDC MSMT LEADCHNL RV IMPEDANCE VALUE: 390 Ohm
MDC IDC MSMT LEADCHNL RV PACING THRESHOLD PULSEWIDTH: 0.5 ms
MDC IDC MSMT LEADCHNL RV SENSING INTR AMPL: 7.2 mV
MDC IDC PG SERIAL: 549569
MDC IDC SET LEADCHNL RV PACING PULSEWIDTH: 0.5 ms
MDC IDC SET ZONE DETECTION INTERVAL: 300 ms

## 2013-05-31 ENCOUNTER — Encounter: Payer: Self-pay | Admitting: *Deleted

## 2013-06-06 ENCOUNTER — Encounter: Payer: Self-pay | Admitting: Internal Medicine

## 2013-08-21 ENCOUNTER — Ambulatory Visit (INDEPENDENT_AMBULATORY_CARE_PROVIDER_SITE_OTHER): Payer: PRIVATE HEALTH INSURANCE | Admitting: *Deleted

## 2013-08-21 DIAGNOSIS — Z95810 Presence of automatic (implantable) cardiac defibrillator: Secondary | ICD-10-CM

## 2013-08-21 DIAGNOSIS — I469 Cardiac arrest, cause unspecified: Secondary | ICD-10-CM

## 2013-08-23 LAB — MDC_IDC_ENUM_SESS_TYPE_REMOTE
Battery Voltage: 2.63 V
Brady Statistic RV Percent Paced: 1 %
HighPow Impedance: 80 Ohm
HighPow Impedance: 80 Ohm
Implantable Pulse Generator Serial Number: 549569
Lead Channel Impedance Value: 390 Ohm
Lead Channel Pacing Threshold Amplitude: 1 V
Lead Channel Sensing Intrinsic Amplitude: 4.8 mV
Lead Channel Setting Pacing Amplitude: 2.5 V
Lead Channel Setting Pacing Pulse Width: 0.5 ms
Lead Channel Setting Sensing Sensitivity: 0.3 mV
MDC IDC MSMT BATTERY REMAINING LONGEVITY: 44 mo
MDC IDC MSMT LEADCHNL RV PACING THRESHOLD PULSEWIDTH: 0.5 ms
MDC IDC SESS DTM: 20150406064009
MDC IDC SET ZONE DETECTION INTERVAL: 300 ms

## 2013-08-31 ENCOUNTER — Encounter: Payer: Self-pay | Admitting: *Deleted

## 2013-09-12 ENCOUNTER — Encounter: Payer: Self-pay | Admitting: Internal Medicine

## 2013-11-22 ENCOUNTER — Ambulatory Visit (INDEPENDENT_AMBULATORY_CARE_PROVIDER_SITE_OTHER): Payer: Self-pay | Admitting: *Deleted

## 2013-11-22 ENCOUNTER — Encounter: Payer: Self-pay | Admitting: Internal Medicine

## 2013-11-22 DIAGNOSIS — I469 Cardiac arrest, cause unspecified: Secondary | ICD-10-CM

## 2013-11-22 NOTE — Progress Notes (Signed)
Remote ICD transmission.   

## 2013-11-23 ENCOUNTER — Telehealth: Payer: Self-pay | Admitting: Internal Medicine

## 2013-11-23 NOTE — Telephone Encounter (Signed)
New problem   Pt is being charge for a [employer] monitor that he does his remote defib with which is very expensive. Pt want to get it deactivated and start coming to office. Please call pt.

## 2013-11-23 NOTE — Telephone Encounter (Signed)
Spoke with wife and they are requesting have Merlin discontinued for financial reasons an be seen in office only.  I made her aware that we did process a transmission 11/22/13 and have stopped any further remote schedules.  She will have Brian Arellano call and schedule an office visit with Dr. Ladona Ridgel for October.

## 2013-11-24 LAB — MDC_IDC_ENUM_SESS_TYPE_REMOTE
Battery Voltage: 2.62 V
HIGH POWER IMPEDANCE MEASURED VALUE: 74 Ohm
HighPow Impedance: 74 Ohm
Lead Channel Pacing Threshold Pulse Width: 0.5 ms
Lead Channel Setting Pacing Amplitude: 2.5 V
Lead Channel Setting Pacing Pulse Width: 0.5 ms
MDC IDC MSMT BATTERY REMAINING LONGEVITY: 43 mo
MDC IDC MSMT LEADCHNL RV IMPEDANCE VALUE: 400 Ohm
MDC IDC MSMT LEADCHNL RV PACING THRESHOLD AMPLITUDE: 1 V
MDC IDC MSMT LEADCHNL RV SENSING INTR AMPL: 7.2 mV
MDC IDC PG SERIAL: 549569
MDC IDC SESS DTM: 20150708070849
MDC IDC SET LEADCHNL RV SENSING SENSITIVITY: 0.3 mV
MDC IDC STAT BRADY RV PERCENT PACED: 1 %
Zone Setting Detection Interval: 300 ms

## 2013-12-04 ENCOUNTER — Encounter: Payer: Self-pay | Admitting: Cardiology

## 2014-03-19 ENCOUNTER — Encounter: Payer: Self-pay | Admitting: Internal Medicine

## 2014-03-19 ENCOUNTER — Ambulatory Visit (INDEPENDENT_AMBULATORY_CARE_PROVIDER_SITE_OTHER): Payer: Commercial Managed Care - PPO | Admitting: Internal Medicine

## 2014-03-19 VITALS — BP 132/80 | HR 54 | Ht 70.0 in | Wt 183.4 lb

## 2014-03-19 DIAGNOSIS — Z95810 Presence of automatic (implantable) cardiac defibrillator: Secondary | ICD-10-CM

## 2014-03-19 DIAGNOSIS — I10 Essential (primary) hypertension: Secondary | ICD-10-CM

## 2014-03-19 DIAGNOSIS — I469 Cardiac arrest, cause unspecified: Secondary | ICD-10-CM

## 2014-03-19 LAB — MDC_IDC_ENUM_SESS_TYPE_INCLINIC
Battery Remaining Longevity: 42 mo
Date Time Interrogation Session: 20151102163635
HIGH POWER IMPEDANCE MEASURED VALUE: 79.875
Implantable Pulse Generator Serial Number: 549569
Lead Channel Impedance Value: 362.5 Ohm
Lead Channel Pacing Threshold Amplitude: 1.25 V
Lead Channel Pacing Threshold Amplitude: 1.25 V
Lead Channel Pacing Threshold Pulse Width: 0.5 ms
Lead Channel Sensing Intrinsic Amplitude: 5.8 mV
Lead Channel Setting Pacing Amplitude: 2.5 V
Lead Channel Setting Pacing Pulse Width: 0.5 ms
Lead Channel Setting Sensing Sensitivity: 0.3 mV
MDC IDC MSMT BATTERY VOLTAGE: 2.6 V
MDC IDC MSMT LEADCHNL RV PACING THRESHOLD PULSEWIDTH: 0.5 ms
MDC IDC SET ZONE DETECTION INTERVAL: 300 ms
MDC IDC STAT BRADY RV PERCENT PACED: 0.05 %

## 2014-03-19 NOTE — Progress Notes (Signed)
HPI [name] returns today for followup. He is a very pleasant 38 year old man with a history of resuscitated ventricular fibrillation arrest, and hypertension. He is status post ICD implantation. In the last year, he has done well. He denies chest pain or shortness of breath. No syncope. No ICD discharges. No Known Allergies   Current Outpatient Prescriptions  Medication Sig Dispense Refill  . aspirin 81 MG tablet Take 81 mg by mouth daily.      . fish oil-omega-3 fatty acids 1000 MG capsule Take 2 g by mouth daily.      . metoprolol tartrate (LOPRESSOR) 25 MG tablet TAKE ONE TABLET BY MOUTH TWICE DAILY 180 tablet 3  . Multiple Vitamin (MULTI-VITAMIN PO) Take 1 capsule by mouth daily.      No current facility-administered medications for this visit.     Past Medical History  Diagnosis Date  . Cardiac arrest - ventricular fibrillation   . Allergic rhinitis, seasonal   . Cardiac arrest     ROS:   All systems reviewed and negative except as noted in the HPI.   Past Surgical History  Procedure Laterality Date  . Cardiac defibrillator placement      ICD St Jude     Family History  Problem Relation Age of Onset  . Coronary artery disease Father     cabg x2  . Heart disease      father's brother, bypass surgery  . Heart disease      father's brother, bypass surgery  . Coronary artery disease      family history     History   Social History  . Marital Status: Married    Spouse Name: N/A    Number of Children: N/A  . Years of Education: N/A   Occupational History  . Not on file.   Social History Main Topics  . Smoking status: Never Smoker   . Smokeless tobacco: Never Used  . Alcohol Use: No  . Drug Use: No  . Sexual Activity: Not on file   Other Topics Concern  . Not on file   Social History Narrative      Physical Exam: BP - 132/78, P - 78 Well appearing 38 year old man, NAD HEENT: Unremarkable Neck:  No JVD, no thyromegally Lungs:  Clear  with no wheezes, rales, or rhonchi. HEART:  Regular rate rhythm, no murmurs, no rubs, no clicks. Well-healed ICD incision Abd:  soft, positive bowel sounds, no organomegally, no rebound, no guarding Ext:  2 plus pulses, no edema, no cyanosis, no clubbing Skin:  No rashes no nodules Neuro:  CN II through XII intact, motor grossly intact  DEVICE  Normal device function.  See PaceArt for details.   Assess/Plan:

## 2014-03-19 NOTE — Patient Instructions (Signed)
Your physician recommends that you schedule a follow-up appointment in: 3 months in the device clinic and 12 months with Dr Taylor  

## 2014-03-19 NOTE — Assessment & Plan Note (Signed)
HIs blood pressure is well controlled. No change in meds. He will continue his regular exercise program. He will maintain a low sodium diet.

## 2014-03-19 NOTE — Assessment & Plan Note (Signed)
His St. Jude ICD is working normally. Will recheck in several months.  

## 2014-04-26 ENCOUNTER — Other Ambulatory Visit: Payer: Self-pay | Admitting: Internal Medicine

## 2014-07-09 ENCOUNTER — Encounter: Payer: Self-pay | Admitting: Internal Medicine

## 2015-03-20 ENCOUNTER — Encounter: Payer: Self-pay | Admitting: Internal Medicine

## 2015-03-20 ENCOUNTER — Ambulatory Visit (INDEPENDENT_AMBULATORY_CARE_PROVIDER_SITE_OTHER): Payer: Commercial Managed Care - PPO | Admitting: Internal Medicine

## 2015-03-20 VITALS — BP 128/78 | HR 60 | Ht 70.0 in | Wt 186.2 lb

## 2015-03-20 DIAGNOSIS — I469 Cardiac arrest, cause unspecified: Secondary | ICD-10-CM

## 2015-03-20 DIAGNOSIS — I10 Essential (primary) hypertension: Secondary | ICD-10-CM | POA: Diagnosis not present

## 2015-03-20 DIAGNOSIS — Z95810 Presence of automatic (implantable) cardiac defibrillator: Secondary | ICD-10-CM

## 2015-03-20 LAB — CUP PACEART INCLINIC DEVICE CHECK
Battery Remaining Longevity: 39.6
Battery Voltage: 2.59 V
HighPow Impedance: 81 Ohm
Implantable Lead Implant Date: 20090507
Implantable Lead Location: 753860
Implantable Lead Model: 7122
Lead Channel Pacing Threshold Amplitude: 1.25 V
Lead Channel Pacing Threshold Pulse Width: 0.5 ms
Lead Channel Pacing Threshold Pulse Width: 0.5 ms
Lead Channel Setting Pacing Amplitude: 2.5 V
Lead Channel Setting Pacing Pulse Width: 0.5 ms
Lead Channel Setting Sensing Sensitivity: 0.3 mV
MDC IDC MSMT LEADCHNL RV IMPEDANCE VALUE: 400 Ohm
MDC IDC MSMT LEADCHNL RV PACING THRESHOLD AMPLITUDE: 1.25 V
MDC IDC MSMT LEADCHNL RV SENSING INTR AMPL: 7 mV
MDC IDC SESS DTM: 20161102131828
MDC IDC STAT BRADY RV PERCENT PACED: 0 %
Pulse Gen Serial Number: 549569

## 2015-03-20 MED ORDER — METOPROLOL TARTRATE 25 MG PO TABS: 25.0000 mg | ORAL_TABLET | Freq: Two times a day (BID) | ORAL | Status: DC

## 2015-03-20 NOTE — Assessment & Plan Note (Signed)
His St. Jude single-chamber ICD is working normally. Plan to recheck in several months. 

## 2015-03-20 NOTE — Assessment & Plan Note (Signed)
His blood pressure is essentially normal today. No change in medications. He is exercising regularly.

## 2015-03-20 NOTE — Assessment & Plan Note (Signed)
He has had no recurrent ventricular arrhythmias.

## 2015-03-20 NOTE — Patient Instructions (Signed)
Your physician recommends that you continue on your current medications as directed. Please refer to the Current Medication list given to you today. Your physician wants you to follow-up in: 1 year with Dr. Ladona Ridgel.  You will receive a reminder letter in the mail two months in advance. If you don't receive a letter, please call our office to schedule the follow-up appointment. Your physician wants you to follow-up in: 3 months with device clinic.   You will receive a reminder letter in the mail two months in advance. If you don't receive a letter, please call our office to schedule the follow-up appointment.

## 2015-03-20 NOTE — Progress Notes (Signed)
HPI [name] returns today for followup. He is a very pleasant 40 year old man with a history of resuscitated ventricular fibrillation arrest, and hypertension. He is status post ICD implantation. In the last year, he has done well. He denies chest pain or shortness of breath. No syncope. No ICD discharges. No Known Allergies   Current Outpatient Prescriptions  Medication Sig Dispense Refill  . aspirin 81 MG tablet Take 81 mg by mouth daily.      . fish oil-omega-3 fatty acids 1000 MG capsule Take 2 g by mouth daily.      . metoprolol tartrate (LOPRESSOR) 25 MG tablet TAKE ONE TABLET BY MOUTH TWICE DAILY 180 tablet 3  . Multiple Vitamin (MULTI-VITAMIN PO) Take 1 capsule by mouth daily.     Marland Kitchen POTASSIUM PO Take 1 tablet by mouth daily.     No current facility-administered medications for this visit.     Past Medical History  Diagnosis Date  . Cardiac arrest - ventricular fibrillation   . Allergic rhinitis, seasonal   . Cardiac arrest (HCC)     ROS:   All systems reviewed and negative except as noted in the HPI.   Past Surgical History  Procedure Laterality Date  . Cardiac defibrillator placement      ICD St Jude     Family History  Problem Relation Age of Onset  . Coronary artery disease Father     cabg x2  . Heart disease      father's brother, bypass surgery/father's brother, bypass surgery  . Coronary artery disease      family history  . Atrial fibrillation Cousin 25     Social History   Social History  . Marital Status: Married    Spouse Name: N/A  . Number of Children: N/A  . Years of Education: N/A   Occupational History  . Not on file.   Social History Main Topics  . Smoking status: Never Smoker   . Smokeless tobacco: Never Used  . Alcohol Use: No  . Drug Use: No  . Sexual Activity: Not on file   Other Topics Concern  . Not on file   Social History Narrative      Physical Exam: BP - 128/78, P - 60 Well appearing 39 year old man,  NAD HEENT: Unremarkable Neck:  6 cm JVD, no thyromegally Lungs:  Clear with no wheezes, rales, or rhonchi. HEART:  Regular rate rhythm, no murmurs, no rubs, no clicks. Well-healed ICD incision Abd:  soft, positive bowel sounds, no organomegally, no rebound, no guarding Ext:  2 plus pulses, no edema, no cyanosis, no clubbing Skin:  No rashes no nodules Neuro:  CN II through XII intact, motor grossly intact  DEVICE  Normal device function.  See PaceArt for details.   Assess/Plan:

## 2016-03-10 ENCOUNTER — Encounter: Payer: Self-pay | Admitting: Internal Medicine

## 2016-03-20 ENCOUNTER — Encounter: Payer: Self-pay | Admitting: Internal Medicine

## 2016-03-20 ENCOUNTER — Ambulatory Visit (INDEPENDENT_AMBULATORY_CARE_PROVIDER_SITE_OTHER): Payer: Commercial Managed Care - PPO | Admitting: Internal Medicine

## 2016-03-20 ENCOUNTER — Encounter (INDEPENDENT_AMBULATORY_CARE_PROVIDER_SITE_OTHER): Payer: Self-pay

## 2016-03-20 VITALS — BP 127/77 | HR 52 | Ht 70.0 in | Wt 172.6 lb

## 2016-03-20 DIAGNOSIS — Z95810 Presence of automatic (implantable) cardiac defibrillator: Secondary | ICD-10-CM | POA: Diagnosis not present

## 2016-03-20 DIAGNOSIS — I469 Cardiac arrest, cause unspecified: Secondary | ICD-10-CM | POA: Diagnosis not present

## 2016-03-20 NOTE — Progress Notes (Signed)
HPI Brian Arellano returns today for followup. He is a very pleasant 40 year old man with a history of resuscitated ventricular fibrillation arrest, and hypertension. He is status post ICD implantation. In the last year, he has done well. He denies chest pain or shortness of breath. No syncope. No ICD discharges. He is approaching ERI. No Known Allergies   Current Outpatient Prescriptions  Medication Sig Dispense Refill  . aspirin 81 MG tablet Take 81 mg by mouth daily.      . fish oil-omega-3 fatty acids 1000 MG capsule Take 2 g by mouth daily.      . metoprolol tartrate (LOPRESSOR) 25 MG tablet Take 1 tablet (25 mg total) by mouth 2 (two) times daily. 180 tablet 3  . Multiple Vitamin (MULTI-VITAMIN PO) Take 1 capsule by mouth daily.     Marland Kitchen POTASSIUM PO Take 1 tablet by mouth daily.     No current facility-administered medications for this visit.      Past Medical History:  Diagnosis Date  . Allergic rhinitis, seasonal   . Cardiac arrest (HCC)   . Cardiac arrest - ventricular fibrillation     ROS:   All systems reviewed and negative except as noted in the HPI.   Past Surgical History:  Procedure Laterality Date  . CARDIAC DEFIBRILLATOR PLACEMENT     ICD St Jude     Family History  Problem Relation Age of Onset  . Atrial fibrillation Cousin 25  . Coronary artery disease Father     cabg x2  . Heart disease      father's brother, bypass surgery/father's brother, bypass surgery  . Coronary artery disease      family history     Social History   Social History  . Marital status: Married    Spouse name: N/A  . Number of children: N/A  . Years of education: N/A   Occupational History  . Not on file.   Social History Main Topics  . Smoking status: Never Smoker  . Smokeless tobacco: Never Used  . Alcohol use No  . Drug use: No  . Sexual activity: Not on file   Other Topics Concern  . Not on file   Social History Narrative  . No narrative on file       Physical Exam: BP - 127/77, P - 52 Well appearing 40 year old man, NAD HEENT: Unremarkable Neck:  6 cm JVD, no thyromegally Lungs:  Clear with no wheezes, rales, or rhonchi. HEART:  Regular rate rhythm, no murmurs, no rubs, no clicks. Well-healed ICD incision Abd:  soft, positive bowel sounds, no organomegally, no rebound, no guarding Ext:  2 plus pulses, no edema, no cyanosis, no clubbing Skin:  No rashes no nodules Neuro:  CN II through XII intact, motor grossly intact  DEVICE  Normal device function.  See PaceArt for details.   Assess/Plan:  1. VF arrest - he has had no additional ventricular arrhythmias since his last episode. 2. ICD - he is approaching ERI. He would like his next device to be placed sub pectorally 3. HTN - his blood pressure is well controlled. Will follow.   Leonia Reeves.D.

## 2016-03-20 NOTE — Patient Instructions (Addendum)

## 2016-03-24 LAB — CUP PACEART INCLINIC DEVICE CHECK
Battery Remaining Longevity: 5 mo
Battery Voltage: 2.5 V
Brady Statistic RV Percent Paced: 0.02 %
Date Time Interrogation Session: 20171103133027
HighPow Impedance: 74.25 Ohm
Implantable Lead Implant Date: 20090507
Implantable Lead Location: 753860
Implantable Lead Model: 7122
Implantable Pulse Generator Implant Date: 20090507
Lead Channel Impedance Value: 400 Ohm
Lead Channel Pacing Threshold Pulse Width: 0.5 ms
Lead Channel Setting Sensing Sensitivity: 0.3 mV
MDC IDC MSMT LEADCHNL RV PACING THRESHOLD AMPLITUDE: 1.25 V
MDC IDC MSMT LEADCHNL RV SENSING INTR AMPL: 4.3 mV
MDC IDC SET LEADCHNL RV PACING AMPLITUDE: 2.5 V
MDC IDC SET LEADCHNL RV PACING PULSEWIDTH: 0.5 ms
Pulse Gen Serial Number: 549569

## 2016-04-14 ENCOUNTER — Other Ambulatory Visit: Payer: Self-pay | Admitting: Internal Medicine

## 2016-04-14 DIAGNOSIS — I10 Essential (primary) hypertension: Secondary | ICD-10-CM

## 2016-05-29 ENCOUNTER — Telehealth: Payer: Self-pay

## 2016-05-29 NOTE — Telephone Encounter (Signed)
Spoke with patient stating that his vibratory alert went off yesterday and today. At his last check on 11/3 his device had an estimated 66mos remaining. Patient was offered a device clinic appointment for today or Monday. Patient requested to wait until his scheduled appt on 2/8. I explained that it was possible for his device to vibrate for other reasons and since he did not have a home monitor there was no way to confirm that this was his battery. He verbalized understanding. We will leave his current appointment as scheduled and patient will call if he as any other concerns or chooses to come in earlier. I explained that the vibratory alert would continue until it timed out. He verbalized understanding.

## 2016-06-25 ENCOUNTER — Ambulatory Visit (INDEPENDENT_AMBULATORY_CARE_PROVIDER_SITE_OTHER): Payer: Commercial Managed Care - PPO | Admitting: *Deleted

## 2016-06-25 DIAGNOSIS — I469 Cardiac arrest, cause unspecified: Secondary | ICD-10-CM

## 2016-06-25 LAB — CUP PACEART INCLINIC DEVICE CHECK
Battery Voltage: 2.41 V
Brady Statistic RV Percent Paced: 0.02 %
Date Time Interrogation Session: 20180208092410
HIGH POWER IMPEDANCE MEASURED VALUE: 79.875
Implantable Lead Implant Date: 20090507
Implantable Lead Location: 753860
Lead Channel Impedance Value: 375 Ohm
Lead Channel Pacing Threshold Amplitude: 1.25 V
Lead Channel Pacing Threshold Pulse Width: 0.5 ms
Lead Channel Setting Pacing Amplitude: 2.5 V
Lead Channel Setting Pacing Pulse Width: 0.5 ms
MDC IDC MSMT LEADCHNL RV SENSING INTR AMPL: 4.3 mV
MDC IDC PG IMPLANT DT: 20090507
MDC IDC SET LEADCHNL RV SENSING SENSITIVITY: 0.3 mV
Pulse Gen Serial Number: 549569

## 2016-06-25 NOTE — Progress Notes (Signed)
ICD check in clinic. Normal device function. Threshold and sensing consistent with previous device measurements. Impedance trends stable over time. No evidence of any ventricular arrhythmias. Histogram distribution appropriate for patient and level of activity. No changes made this session. Device programmed at appropriate safety margins. Device programmed to optimize intrinsic conduction. ERI reached on 05/26/16. Pt will follow up with GT first available to discuss gen change.

## 2016-07-03 ENCOUNTER — Encounter: Payer: Self-pay | Admitting: Internal Medicine

## 2016-07-03 ENCOUNTER — Ambulatory Visit (INDEPENDENT_AMBULATORY_CARE_PROVIDER_SITE_OTHER): Payer: Commercial Managed Care - PPO | Admitting: Internal Medicine

## 2016-07-03 VITALS — BP 124/82 | HR 57 | Ht 70.0 in | Wt 179.8 lb

## 2016-07-03 DIAGNOSIS — Z01812 Encounter for preprocedural laboratory examination: Secondary | ICD-10-CM

## 2016-07-03 DIAGNOSIS — I4901 Ventricular fibrillation: Secondary | ICD-10-CM | POA: Diagnosis not present

## 2016-07-03 NOTE — Patient Instructions (Signed)
Medication Instructions:  Your physician recommends that you continue on your current medications as directed. Please refer to the Current Medication list given to you today.   Labwork: BMET / CBC w/ diff at the [facility] office on 08/07/16   Testing/Procedures: ICD Generator Change  Follow-Up: Follow-up for a wound check in the Device Clinic 10-14 days from 08/13/16 and with Dr. Ladona Ridgel 91 days after 08/13/16.    Any Other Special Instructions Will Be Listed Below ---  Please wash with the CHG Soap the night before and morning of procedure (follow instruction page "Preparing For Surgery").   [facility]16 at 5:30 AM  Nothing to eat or drink after midnight the night before procedure  Do not take any medication the morning of your procedure  You will need someone to drive you home after the procedure    If you need a refill on your cardiac medications before your next appointment, please call your pharmacy.

## 2016-07-03 NOTE — Progress Notes (Signed)
HPI Mr. Rodela returns today for followup. He is a very pleasant 41 year old man with a history of resuscitated ventricular fibrillation arrest, and hypertension. He is status post ICD implantation. In the last year, he has done well. He denies chest pain or shortness of breath. No syncope. No ICD discharges. He has reached ERI. No other complaints today. No Known Allergies   Current Outpatient Prescriptions  Medication Sig Dispense Refill  . aspirin 81 MG tablet Take 81 mg by mouth daily.      . fish oil-omega-3 fatty acids 1000 MG capsule Take 1 g by mouth daily.     . metoprolol tartrate (LOPRESSOR) 25 MG tablet TAKE ONE TABLET BY MOUTH TWICE DAILY 180 tablet 3  . Multiple Vitamin (MULTI-VITAMIN PO) Take 1 capsule by mouth daily.     Marland Kitchen POTASSIUM PO Take 1 tablet by mouth daily.     No current facility-administered medications for this visit.      Past Medical History:  Diagnosis Date  . Allergic rhinitis, seasonal   . Cardiac arrest (HCC)   . Cardiac arrest - ventricular fibrillation     ROS:   All systems reviewed and negative except as noted in the HPI.   Past Surgical History:  Procedure Laterality Date  . CARDIAC DEFIBRILLATOR PLACEMENT     ICD St Jude     Family History  Problem Relation Age of Onset  . Atrial fibrillation Cousin 25  . Coronary artery disease Father     cabg x2  . Heart disease      father's brother, bypass surgery/father's brother, bypass surgery  . Coronary artery disease      family history     Social History   Social History  . Marital status: Married    Spouse name: N/A  . Number of children: N/A  . Years of education: N/A   Occupational History  . Not on file.   Social History Main Topics  . Smoking status: Never Smoker  . Smokeless tobacco: Never Used  . Alcohol use No  . Drug use: No  . Sexual activity: Not on file   Other Topics Concern  . Not on file   Social History Narrative  . No narrative on file       Physical Exam: BP - 124/82, P - 57, R - 16 Well appearing 41 year old man, NAD HEENT: Unremarkable Neck:  6 cm JVD, no thyromegally Lungs:  Clear with no wheezes, rales, or rhonchi. HEART:  Regular rate rhythm, no murmurs, no rubs, no clicks. Well-healed ICD incision Abd:  soft, positive bowel sounds, no organomegally, no rebound, no guarding Ext:  2 plus pulses, no edema, no cyanosis, no clubbing Skin:  No rashes no nodules Neuro:  CN II through XII intact, motor grossly intact  DEVICE  Normal device function.  See PaceArt for details. Device [facility] at [facility].   Assess/Plan:  1. VF arrest - he has had no additional ventricular arrhythmias since his last episode. 2. ICD - he has reached ERI. He would like his next device to be placed sub pectorally 3. HTN - his blood pressure is well controlled. Will follow.   Leonia Reeves.D.

## 2016-07-29 ENCOUNTER — Other Ambulatory Visit: Payer: Self-pay | Admitting: Internal Medicine

## 2016-08-07 ENCOUNTER — Other Ambulatory Visit: Payer: Commercial Managed Care - PPO | Admitting: *Deleted

## 2016-08-07 DIAGNOSIS — Z95810 Presence of automatic (implantable) cardiac defibrillator: Secondary | ICD-10-CM

## 2016-08-07 DIAGNOSIS — I10 Essential (primary) hypertension: Secondary | ICD-10-CM

## 2016-08-07 DIAGNOSIS — I469 Cardiac arrest, cause unspecified: Secondary | ICD-10-CM

## 2016-08-07 LAB — BASIC METABOLIC PANEL
BUN/Creatinine Ratio: 19 (ref 9–20)
BUN: 15 mg/dL (ref 6–24)
CO2: 22 mmol/L (ref 18–29)
Calcium: 9.4 mg/dL (ref 8.7–10.2)
Chloride: 100 mmol/L (ref 96–106)
Creatinine, Ser: 0.81 mg/dL (ref 0.76–1.27)
GFR calc Af Amer: 128 mL/min/{1.73_m2} (ref >59)
GFR, EST NON AFRICAN AMERICAN: 110 mL/min/{1.73_m2} (ref >59)
Glucose: 80 mg/dL (ref 65–99)
Potassium: 4.7 mmol/L (ref 3.5–5.2)
SODIUM: 140 mmol/L (ref 134–144)

## 2016-08-07 LAB — CBC WITH DIFFERENTIAL/PLATELET
BASOS: 0 %
Basophils Absolute: 0 10*3/uL (ref 0.0–0.2)
EOS (ABSOLUTE): 0.1 10*3/uL (ref 0.0–0.4)
EOS: 3 %
HEMATOCRIT: 45.7 % (ref 37.5–51.0)
Hemoglobin: 15.2 g/dL (ref 13.0–17.7)
IMMATURE GRANS (ABS): 0 10*3/uL (ref 0.0–0.1)
IMMATURE GRANULOCYTES: 0 %
Lymphocytes Absolute: 1.2 10*3/uL (ref 0.7–3.1)
Lymphs: 25 %
MCH: 29.7 pg (ref 26.6–33.0)
MCHC: 33.3 g/dL (ref 31.5–35.7)
MCV: 89 fL (ref 79–97)
MONOS ABS: 0.6 10*3/uL (ref 0.1–0.9)
Monocytes: 11 %
NEUTROS PCT: 61 %
Neutrophils Absolute: 2.9 10*3/uL (ref 1.4–7.0)
Platelets: 124 10*3/uL — ABNORMAL LOW (ref 150–379)
RBC: 5.12 x10E6/uL (ref 4.14–5.80)
RDW: 14.2 % (ref 12.3–15.4)
WBC: 4.8 10*3/uL (ref 3.4–10.8)

## 2016-08-07 NOTE — Addendum Note (Signed)
Addended by: Tonita Phoenix on: 08/07/2016 08:16 AM   Modules accepted: Orders

## 2016-08-13 ENCOUNTER — Encounter (HOSPITAL_COMMUNITY)
Admission: RE | Disposition: A | Discharge status: Home / Self Care | Payer: Self-pay | Source: Ambulatory Visit | Attending: Internal Medicine

## 2016-08-13 ENCOUNTER — Ambulatory Visit (HOSPITAL_COMMUNITY)
Admission: RE | Admit: 2016-08-13 | Discharge: 2016-08-13 | Disposition: A | Discharge status: Home / Self Care | Payer: Commercial Managed Care - PPO | Source: Ambulatory Visit | Attending: Internal Medicine | Admitting: Internal Medicine

## 2016-08-13 ENCOUNTER — Encounter (HOSPITAL_COMMUNITY): Payer: Self-pay | Admitting: *Deleted

## 2016-08-13 DIAGNOSIS — I10 Essential (primary) hypertension: Secondary | ICD-10-CM | POA: Diagnosis not present

## 2016-08-13 DIAGNOSIS — I4901 Ventricular fibrillation: Secondary | ICD-10-CM | POA: Insufficient documentation

## 2016-08-13 DIAGNOSIS — Z7982 Long term (current) use of aspirin: Secondary | ICD-10-CM | POA: Insufficient documentation

## 2016-08-13 DIAGNOSIS — Z95810 Presence of automatic (implantable) cardiac defibrillator: Secondary | ICD-10-CM | POA: Diagnosis present

## 2016-08-13 DIAGNOSIS — Z8674 Personal history of sudden cardiac arrest: Secondary | ICD-10-CM | POA: Diagnosis not present

## 2016-08-13 DIAGNOSIS — Z8249 Family history of ischemic heart disease and other diseases of the circulatory system: Secondary | ICD-10-CM | POA: Insufficient documentation

## 2016-08-13 DIAGNOSIS — Z4502 Encounter for adjustment and management of automatic implantable cardiac defibrillator: Secondary | ICD-10-CM | POA: Insufficient documentation

## 2016-08-13 HISTORY — PX: ICD GENERATOR CHANGEOUT: EP1231

## 2016-08-13 LAB — SURGICAL PCR SCREEN
MRSA, PCR: NEGATIVE
Staphylococcus aureus: POSITIVE — AB

## 2016-08-13 SURGERY — ICD GENERATOR CHANGEOUT

## 2016-08-13 MED ORDER — ONDANSETRON HCL 4 MG/2ML IJ SOLN: 4.0000 mg | Freq: Four times a day (QID) | INTRAMUSCULAR | Status: DC | PRN

## 2016-08-13 MED ORDER — SODIUM CHLORIDE 0.9 % IV SOLN
INTRAVENOUS | Status: DC
Start: 2016-08-13 — End: 2016-08-13
  Administered 2016-08-13: 07:00:00 via INTRAVENOUS

## 2016-08-13 MED ORDER — MIDAZOLAM HCL 5 MG/5ML IJ SOLN
INTRAMUSCULAR | Status: AC
  Filled 2016-08-13: qty 5

## 2016-08-13 MED ORDER — MUPIROCIN 2 % EX OINT
1.0000 "application " | TOPICAL_OINTMENT | Freq: Once | CUTANEOUS | Status: AC
  Administered 2016-08-13: 1 via TOPICAL
  Filled 2016-08-13: qty 22

## 2016-08-13 MED ORDER — FENTANYL CITRATE (PF) 100 MCG/2ML IJ SOLN
INTRAMUSCULAR | Status: AC
  Filled 2016-08-13: qty 2

## 2016-08-13 MED ORDER — SODIUM CHLORIDE 0.9 % IR SOLN
80.0000 mg | Status: AC
  Administered 2016-08-13: 80 mg
  Filled 2016-08-13: qty 2

## 2016-08-13 MED ORDER — MIDAZOLAM HCL 5 MG/5ML IJ SOLN
INTRAMUSCULAR | Status: DC | PRN
  Administered 2016-08-13 (×3): 1 mg via INTRAVENOUS
  Administered 2016-08-13 (×4): 2 mg via INTRAVENOUS

## 2016-08-13 MED ORDER — CHLORPROMAZINE HCL 10 MG PO TABS
10.0000 mg | ORAL_TABLET | Freq: Three times a day (TID) | ORAL | Status: DC | PRN
  Filled 2016-08-13: qty 1

## 2016-08-13 MED ORDER — CEFAZOLIN SODIUM-DEXTROSE 2-4 GM/100ML-% IV SOLN
2.0000 g | INTRAVENOUS | Status: AC
  Administered 2016-08-13: 2 g via INTRAVENOUS
  Filled 2016-08-13: qty 100

## 2016-08-13 MED ORDER — GENTAMICIN SULFATE 40 MG/ML IJ SOLN
INTRAMUSCULAR | Status: AC
  Filled 2016-08-13: qty 2

## 2016-08-13 MED ORDER — LIDOCAINE HCL (PF) 1 % IJ SOLN
INTRAMUSCULAR | Status: AC
  Filled 2016-08-13: qty 60

## 2016-08-13 MED ORDER — LIDOCAINE HCL (PF) 1 % IJ SOLN
INTRAMUSCULAR | Status: DC | PRN
  Administered 2016-08-13: 60 mL

## 2016-08-13 MED ORDER — CHLORHEXIDINE GLUCONATE 4 % EX LIQD
60.0000 mL | Freq: Once | CUTANEOUS | Status: DC
  Filled 2016-08-13: qty 60

## 2016-08-13 MED ORDER — CEFAZOLIN SODIUM-DEXTROSE 2-4 GM/100ML-% IV SOLN
INTRAVENOUS | Status: AC
  Filled 2016-08-13: qty 100

## 2016-08-13 MED ORDER — FENTANYL CITRATE (PF) 100 MCG/2ML IJ SOLN
INTRAMUSCULAR | Status: DC | PRN
  Administered 2016-08-13 (×5): 25 ug via INTRAVENOUS

## 2016-08-13 MED ORDER — ACETAMINOPHEN 325 MG PO TABS: 325.0000 mg | ORAL_TABLET | ORAL | Status: DC | PRN

## 2016-08-13 MED ORDER — MUPIROCIN 2 % EX OINT
TOPICAL_OINTMENT | CUTANEOUS | Status: AC
  Filled 2016-08-13: qty 22

## 2016-08-13 SURGICAL SUPPLY — 4 items
CABLE SURGICAL S-101-97-12 (CABLE) ×3 IMPLANT
ICD ELLIPSE VR CD1411-36C (ICD Generator) ×3 IMPLANT
PAD DEFIB LIFELINK (PAD) ×3 IMPLANT
TRAY PACEMAKER INSERTION (PACKS) ×3 IMPLANT

## 2016-08-13 NOTE — Progress Notes (Signed)
Hiccups continue since arrival to short stay/ Dr Ladona Ridgel aware. Call made to [facility] for EP. Orders to follow.

## 2016-08-13 NOTE — H&P (Signed)
HPI Mr. Teruel returns today for followup. He is a very pleasant 41 year old man with a history of resuscitated ventricular fibrillation arrest, and hypertension. He is status post ICD implantation. In the last year, he has done well. He denies chest pain or shortness of breath. No syncope. No ICD discharges. He has reached ERI. No other complaints today. No Known Allergies         Current Outpatient Prescriptions  Medication Sig Dispense Refill  . aspirin 81 MG tablet Take 81 mg by mouth daily.      . fish oil-omega-3 fatty acids 1000 MG capsule Take 1 g by mouth daily.     . metoprolol tartrate (LOPRESSOR) 25 MG tablet TAKE ONE TABLET BY MOUTH TWICE DAILY 180 tablet 3  . Multiple Vitamin (MULTI-VITAMIN PO) Take 1 capsule by mouth daily.     Marland Kitchen POTASSIUM PO Take 1 tablet by mouth daily.     No current facility-administered medications for this visit.          Past Medical History:  Diagnosis Date  . Allergic rhinitis, seasonal   . Cardiac arrest (HCC)   . Cardiac arrest - ventricular fibrillation     ROS:   All systems reviewed and negative except as noted in the HPI.        Past Surgical History:  Procedure Laterality Date  . CARDIAC DEFIBRILLATOR PLACEMENT     ICD St Jude           Family History  Problem Relation Age of Onset  . Atrial fibrillation Cousin 25  . Coronary artery disease Father     cabg x2  . Heart disease      father's brother, bypass surgery/father's brother, bypass surgery  . Coronary artery disease      family history     Social History        Social History  . Marital status: Married    Spouse name: N/A  . Number of children: N/A  . Years of education: N/A      Occupational History  . Not on file.       Social History Main Topics  . Smoking status: Never Smoker  . Smokeless tobacco: Never Used  . Alcohol use No  . Drug use: No  . Sexual activity: Not on file       Other  Topics Concern  . Not on file      Social History Narrative  . No narrative on file      Physical Exam: BP - 124/82, P - 57, R - 16 Well appearing 41 year old man, NAD HEENT: Unremarkable Neck:  6 cm JVD, no thyromegally Lungs:  Clear with no wheezes, rales, or rhonchi. HEART:  Regular rate rhythm, no murmurs, no rubs, no clicks. Well-healed ICD incision Abd:  soft, positive bowel sounds, no organomegally, no rebound, no guarding Ext:  2 plus pulses, no edema, no cyanosis, no clubbing Skin:  No rashes no nodules Neuro:  CN II through XII intact, motor grossly intact  DEVICE  Normal device function.  See PaceArt for details. Device is at [facility].   Assess/Plan:  1. VF arrest - he has had no additional ventricular arrhythmias since his last episode. 2. ICD - he has reached ERI. He would like his next device to be placed sub pectorally 3. HTN - his blood pressure is well controlled. Will follow.   Gerhard Munch.  EP Attending  Patient seen and examined. Agree with above. Since his last clinic  visit, no change in the history, exam, assessment and plan.   Sharlot Gowda Taylor,M.D [place] Reeves.D.

## 2016-08-13 NOTE — Progress Notes (Addendum)
Pt was getting dressed and developed dizziness and nausea. Pt was laid flat in trendelenburg position, then given oral fluids// Renee PA for EP wwas paged and will come to see the pt. VS trending up 86/53 then and now 106/67 pt feeling better. Dr Ladona Ridgel and Luster Landsberg assessed the pt and he can go when able/ VSS, I will slowly upright the pt till able to dc.

## 2016-08-13 NOTE — Discharge Instructions (Signed)
Pacemaker Battery Change, Care After °This sheet gives you information about how to care for yourself after your procedure. Your health care provider may also give you more specific instructions. If you have problems or questions, contact your health care provider. °What can I expect after the procedure? °After your procedure, it is common to have: °· Pain or soreness at the site where the pacemaker was inserted. °· Swelling at the site where the pacemaker was inserted. ° °Follow these instructions at home: °Incision care °· Keep the incision clean and dry. °? Do not take baths, swim, or use a hot tub until your health care provider approves. °? You may shower the day after your procedure, or as directed by your health care provider. °? Pat the area dry with a clean towel. Do not rub the area. This may cause bleeding. °· Follow instructions from your health care provider about how to take care of your incision. Make sure you: °? Wash your hands with soap and water before you change your bandage (dressing). If soap and water are not available, use hand sanitizer. °? Change your dressing as told by your health care provider. °? Leave stitches (sutures), skin glue, or adhesive strips in place. These skin closures may need to stay in place for 2 weeks or longer. If adhesive strip edges start to loosen and curl up, you may trim the loose edges. Do not remove adhesive strips completely unless your health care provider tells you to do that. °· Check your incision area every day for signs of infection. Check for: °? More redness, swelling, or pain. °? More fluid or blood. °? Warmth. °? Pus or a bad smell. °Activity °· Do not lift anything that is heavier than 10 lb (4.5 kg) until your health care provider says it is okay to do so. °· For the first 2 weeks, or as long as told by your health care provider: °? Avoid lifting your left arm higher than your shoulder. °? Be gentle when you move your arms over your head. It is okay  to raise your arm to comb your hair. °? Avoid strenuous exercise. °· Ask your health care provider when it is okay to: °? Resume your normal activities. °? Return to work or school. °? Resume sexual activity. °Eating and drinking °· Eat a heart-healthy diet. This should include plenty of fresh fruits and vegetables, whole grains, low-fat dairy products, and lean protein like chicken and fish. °· Limit alcohol intake to no more than 1 drink a day for non-pregnant women and 2 drinks a day for men. One drink equals 12 oz of beer, 5 oz of wine, or 1½ oz of hard liquor. °· Check ingredients and nutrition facts on packaged foods and beverages. Avoid the following types of food: °? Food that is high in salt (sodium). °? Food that is high in saturated fat, like full-fat dairy or red meat. °? Food that is high in trans fat, like fried food. °? Food and drinks that are high in sugar. °Lifestyle °· Do not use any products that contain nicotine or tobacco, such as cigarettes and e-cigarettes. If you need help quitting, ask your health care provider. °· Take steps to manage and control your weight. °· Get regular exercise. Aim for 150 minutes of moderate-intensity exercise (such as walking or yoga) or 75 minutes of vigorous exercise (such as running or swimming) each week. °· Manage other health problems, such as diabetes or high blood pressure. Ask your health   care provider how you can manage these conditions. °General instructions °· Do not drive for 24 hours after your procedure if you were given a medicine to help you relax (sedative). °· Take over-the-counter and prescription medicines only as told by your health care provider. °· Avoid putting pressure on the area where the pacemaker was placed. °· If you need an MRI after your pacemaker has been placed, be sure to tell the health care provider who orders the MRI that you have a pacemaker. °· Avoid close and prolonged exposure to electrical devices that have strong  magnetic fields. These include: °? Cell phones. Avoid keeping them in a pocket near the pacemaker, and try using the ear opposite the pacemaker. °? MP3 players. °? Household appliances, like microwaves. °? Metal detectors. °? Electric generators. °? High-tension wires. °· Keep all follow-up visits as directed by your health care provider. This is important. °Contact a health care provider if: °· You have pain at the incision site that is not relieved by over-the-counter or prescription medicines. °· You have any of these around your incision site or coming from it: °? More redness, swelling, or pain. °? Fluid or blood. °? Warmth to the touch. °? Pus or a bad smell. °· You have a fever. °· You feel brief, occasional palpitations, light-headedness, or any symptoms that you think might be related to your heart. °Get help right away if: °· You experience chest pain that is different from the pain at the pacemaker site. °· You develop a red streak that extends above or below the incision site. °· You experience shortness of breath. °· You have palpitations or an irregular heartbeat. °· You have light-headedness that does not go away quickly. °· You faint or have dizzy spells. °· Your pulse suddenly drops or increases rapidly and does not return to normal. °· You begin to gain weight and your legs and ankles swell. °Summary °· After your procedure, it is common to have pain, soreness, and some swelling where the pacemaker was inserted. °· Make sure to keep your incision clean and dry. Follow instructions from your health care provider about how to take care of your incision. °· Check your incision every day for signs of infection, such as more pain or swelling, pus or a bad smell, warmth, or leaking fluid and blood. °· Avoid strenuous exercise and lifting your left arm higher than your shoulder for 2 weeks, or as long as told by your health care provider. °This information is not intended to replace advice given to you by  your health care provider. Make sure you discuss any questions you have with your health care provider. °Document Released: 02/22/2013 Document Revised: 03/26/2016 Document Reviewed: 03/26/2016 °Elsevier Interactive Patient Education © 2017 Elsevier Inc[employer] °Mupirocin nasal ointment °What is this medicine? °MUPIROCIN CALCIUM (myoo PEER oh sin KAL see um) is an antibiotic. It is used inside the nose to treat infections that are caused by certain bacteria. This helps prevent the spread of infection to patients and health care workers during outbreaks at institutions. °This medicine may be used for other purposes; ask your health care provider or pharmacist if you have questions. °COMMON BRAND NAME(S): Bactroban °What should I tell my health care provider before I take this medicine? °They need to know if you have any of these conditions: °-an unusual or allergic reaction to mupirocin, other medicines, foods, dyes, or preservatives °-pregnant or trying to get pregnant °-breast-feeding °How should I use this medicine? °This medicine is   only for use inside the nose. Follow the directions on the prescription label. Wash your hands before and after use. Squeeze half the contents of a single-use tube into one nostril, then squeeze the other half into the other nostril. Press the sides of your nose together and gently massage after application to spread the ointment throughout the nostrils. Do not use your medicine more often than directed. Finish the full course of medicine prescribed by your doctor or health care professional even if you think your condition is better. °Talk to your pediatrician regarding the use of this medicine in children. Special care may be needed. °Overdosage: If you think you have taken too much of this medicine contact a poison control center or emergency room at once. °NOTE: This medicine is only for you. Do not share this medicine with others. °What if I miss a dose? °If you miss a dose, take it as  soon as you can. If it is almost time for your next dose, take only that dose. Do not take double or extra doses. °What may interact with this medicine? °Interactions are not expected. Do not use any other nose products without telling your doctor or health care professional. °This list may not describe all possible interactions. Give your health care provider a list of all the medicines, herbs, non-prescription drugs, or dietary supplements you use. Also tell them if you smoke, drink alcohol, or use illegal drugs. Some items may interact with your medicine. °What should I watch for while using this medicine? °If your nose is severely irritated, burning or stinging from use of this medicine, stop using it and contact your doctor or health care professional. °Do not get this medicine in your eyes. If you do, rinse out with plenty of cool tap water. °What side effects may I notice from receiving this medicine? °Side effects that you should report to your doctor or health care professional as soon as possible: °-severe irritation, burning, stinging, or pain °Side effects that usually do not require medical attention (report to your doctor or health care professional if they continue or are bothersome): °-altered taste °-cough °-headache °-skin itching °-sore throat °-stuffy or runny nose °This list may not describe all possible side effects. Call your doctor for medical advice about side effects. You may report side effects to FDA at 1-800-FDA-1088. °Where should I keep my medicine? °Keep out of the reach of children. °Store at room temperature between 15 and 30 degrees C (59 and 86 degrees F). Do not refrigerate. One tube of ointment is for single use in both nostrils. Throw away after use. °NOTE: This sheet is a summary. It may not cover all possible information. If you have questions about this medicine, talk to your doctor, pharmacist, or health care provider. °© 2018 Elsevier/Gold Standard (2007-11-21  14:36:10) ° °

## 2016-08-13 NOTE — H&P (Signed)
ICD Criteria  Current LVEF:55%. Within 12 months prior to implant: No   Heart failure history:No  Cardiomyopathy history: No.  Atrial Fibrillation/Atrial Flutter: No.  Ventricular tachycardia history: No.  Cardiac arrest history: Yes, Ventricular Fibrillation.  History of syndromes with risk of sudden death: Yes, Other  Previous ICD: Yes, Reason for ICD:  Secondary prevention.  Current ICD indication: Secondary  PPM indication: No.   Class I or II Bradycardia indication present: No  Beta Blocker therapy for 3 or more months: Yes, prescribed.   Ace Inhibitor/ARB therapy for 3 or more months: No, medical reason.

## 2016-08-17 ENCOUNTER — Encounter (HOSPITAL_COMMUNITY): Payer: Self-pay | Admitting: Cardiology

## 2016-08-24 ENCOUNTER — Ambulatory Visit (INDEPENDENT_AMBULATORY_CARE_PROVIDER_SITE_OTHER): Payer: Commercial Managed Care - PPO | Admitting: *Deleted

## 2016-08-24 DIAGNOSIS — I4901 Ventricular fibrillation: Secondary | ICD-10-CM

## 2016-08-24 DIAGNOSIS — Z95810 Presence of automatic (implantable) cardiac defibrillator: Secondary | ICD-10-CM | POA: Diagnosis not present

## 2016-08-24 LAB — CUP PACEART INCLINIC DEVICE CHECK
Brady Statistic RV Percent Paced: 0 %
HighPow Impedance: 63 Ohm
Implantable Lead Implant Date: 20090507
Implantable Lead Location: 753860
Implantable Lead Model: 7122
Implantable Pulse Generator Implant Date: 20180329
Lead Channel Impedance Value: 425 Ohm
Lead Channel Pacing Threshold Amplitude: 1 V
Lead Channel Pacing Threshold Pulse Width: 0.5 ms
Lead Channel Sensing Intrinsic Amplitude: 6.8 mV
Lead Channel Setting Pacing Amplitude: 2.5 V
Lead Channel Setting Pacing Pulse Width: 0.5 ms
MDC IDC SESS DTM: 20180409095748
MDC IDC SET LEADCHNL RV SENSING SENSITIVITY: 0.5 mV
Pulse Gen Serial Number: 7410740

## 2016-08-24 NOTE — Progress Notes (Signed)
Wound check appointment. Steri-strips removed. Wound without redness or edema. Incision edges approximated, wound well healed. Normal device function. Thresholds, sensing, and impedances consistent with implant measurements. Device programmed at chronic outputs (generator change). Histogram distribution appropriate for patient and level of activity. No ventricular arrhythmias noted. Patient educated about wound care, arm mobility, lifting restrictions, shock plan. ROV wtih GT on 11/13/16.

## 2016-11-13 ENCOUNTER — Encounter: Payer: Commercial Managed Care - PPO | Admitting: Internal Medicine

## 2016-12-17 ENCOUNTER — Encounter: Payer: Commercial Managed Care - PPO | Admitting: Internal Medicine

## 2017-02-26 ENCOUNTER — Encounter (INDEPENDENT_AMBULATORY_CARE_PROVIDER_SITE_OTHER): Payer: Self-pay

## 2017-02-26 ENCOUNTER — Encounter: Payer: Self-pay | Admitting: Internal Medicine

## 2017-02-26 ENCOUNTER — Ambulatory Visit (INDEPENDENT_AMBULATORY_CARE_PROVIDER_SITE_OTHER): Payer: Commercial Managed Care - PPO | Admitting: Internal Medicine

## 2017-02-26 VITALS — BP 120/84 | HR 60 | Ht 70.0 in | Wt 185.2 lb

## 2017-02-26 DIAGNOSIS — I10 Essential (primary) hypertension: Secondary | ICD-10-CM | POA: Diagnosis not present

## 2017-02-26 DIAGNOSIS — I4901 Ventricular fibrillation: Secondary | ICD-10-CM | POA: Diagnosis not present

## 2017-02-26 DIAGNOSIS — Z95810 Presence of automatic (implantable) cardiac defibrillator: Secondary | ICD-10-CM

## 2017-02-26 LAB — CUP PACEART INCLINIC DEVICE CHECK
Battery Remaining Longevity: 98 mo
Brady Statistic RV Percent Paced: 0 %
Date Time Interrogation Session: 20181012092755
HIGH POWER IMPEDANCE MEASURED VALUE: 64.125
Implantable Lead Implant Date: 20090507
Implantable Lead Location: 753860
Lead Channel Setting Pacing Amplitude: 2.5 V
Lead Channel Setting Pacing Pulse Width: 0.5 ms
Lead Channel Setting Sensing Sensitivity: 0.5 mV
MDC IDC MSMT LEADCHNL RV IMPEDANCE VALUE: 437.5 Ohm
MDC IDC MSMT LEADCHNL RV PACING THRESHOLD AMPLITUDE: 1.25 V
MDC IDC MSMT LEADCHNL RV PACING THRESHOLD PULSEWIDTH: 0.5 ms
MDC IDC MSMT LEADCHNL RV SENSING INTR AMPL: 6.4 mV
MDC IDC PG IMPLANT DT: 20180329
Pulse Gen Serial Number: 7410740

## 2017-02-26 NOTE — Patient Instructions (Signed)
Medication Instructions:  Your physician recommends that you continue on your current medications as directed. Please refer to the Current Medication list given to you today.  Labwork: None ordered.  Testing/Procedures: None ordered.  Follow-Up: Your physician wants you to follow-up in: one year with Dr. Ladona Ridgel.   You will receive a reminder letter in the mail two months in advance. If you don't receive a letter, please call our office to schedule the follow-up appointment.  Remote monitoring is used to monitor your ICD from home. This monitoring reduces the number of office visits required to check your device to one time per year. It allows [place] to keep an eye on the functioning of your device to ensure it is working properly. You are scheduled for a device check from home on 05/31/2016. You may send your transmission at any time that day. If you have a wireless device, the transmission will be sent automatically. After your physician reviews your transmission, you will receive a postcard with your next transmission date.    Any Other Special Instructions Will Be Listed Below (If Applicable).     If you need a refill on your cardiac medications before your next appointment, please call your pharmacy.

## 2017-02-26 NOTE — Progress Notes (Signed)
HPI Mr. Scurti returns today for ongoing evaluation and management of his ICD placed from prior VF arrest. He also has HTN. He had his device placed under the muscle. He was initially sore but now feels well. No chest pain or sob.No ICD shocks. No Known Allergies   Current Outpatient Prescriptions  Medication Sig Dispense Refill  . aspirin 81 MG tablet Take 81 mg by mouth daily.      . fish oil-omega-3 fatty acids 1000 MG capsule Take 1 capsule by mouth daily.     . metoprolol tartrate (LOPRESSOR) 25 MG tablet Take 25 mg by mouth 2 (two) times daily.    . Multiple Vitamins-Minerals (MULTIVITAMIN WITH MINERALS) tablet Take 1 tablet by mouth daily.    . Potassium 95 MG TABS Take 1 tablet by mouth daily.     No current facility-administered medications for this visit.      Past Medical History:  Diagnosis Date  . Allergic rhinitis, seasonal   . Cardiac arrest (HCC)   . Cardiac arrest - ventricular fibrillation     ROS:   All systems reviewed and negative except as noted in the HPI.   Past Surgical History:  Procedure Laterality Date  . CARDIAC DEFIBRILLATOR PLACEMENT     ICD St Jude  . ICD GENERATOR CHANGEOUT N/A 08/13/2016   Procedure: ICD Generator Changeout;  Surgeon: Marinus Maw, MD;  Location: [facility] INVASIVE CV LAB;  Service: Cardiovascular;  Laterality: N/A;     Family History  Problem Relation Age of Onset  . Atrial fibrillation Cousin 25  . Coronary artery disease Father        cabg x2  . Heart disease Unknown        father's brother, bypass surgery/father's brother, bypass surgery  . Coronary artery disease Unknown        family history     Social History   Social History  . Marital status: Married    Spouse name: N/A  . Number of children: N/A  . Years of education: N/A   Occupational History  . Not on file.   Social History Main Topics  . Smoking status: Never Smoker  . Smokeless tobacco: Never Used  . Alcohol use No  . Drug use: No    . Sexual activity: Not on file   Other Topics Concern  . Not on file   Social History Narrative  . No narrative on file     BP 120/84   Pulse 60   Ht 5\' 10"  (1.778 m)   Wt 185 lb 3.2 oz (84 kg)   SpO2 98%   BMI 26.57 kg/m   Physical Exam:  Well appearing 41 yo man, NAD HEENT: Unremarkable Neck:  6 cm JVD, no thyromegally Lymphatics:  No adenopathy Back:  No CVA tenderness Lungs:  Clear with no wheezes HEART:  Regular rate rhythm, no murmurs, no rubs, no clicks Abd:  soft, positive bowel sounds, no organomegally, no rebound, no guarding Ext:  2 plus pulses, no edema, no cyanosis, no clubbing Skin:  No rashes no nodules Neuro:  CN II through XII intact, motor grossly intact  EKG - none  DEVICE  Normal device function.  See PaceArt for details.   Assess/Plan: 1. VF arrest - he has had no additional episodes. He will undergo watchful waiting. 2. ICD - his St. Jude device is working normally. Will recheck in several months. 3. HTN - his blood pressure is well controlled.  Sharlot Gowda  Taylor,M.D.

## 2017-05-31 ENCOUNTER — Ambulatory Visit (INDEPENDENT_AMBULATORY_CARE_PROVIDER_SITE_OTHER): Payer: Commercial Managed Care - PPO | Admitting: *Deleted

## 2017-05-31 DIAGNOSIS — I4901 Ventricular fibrillation: Secondary | ICD-10-CM | POA: Diagnosis not present

## 2017-06-02 NOTE — Progress Notes (Signed)
Remote ICD transmission.   

## 2017-06-03 LAB — CUP PACEART REMOTE DEVICE CHECK
Battery Remaining Longevity: 95 mo
Battery Remaining Percentage: 92 %
Battery Voltage: 3.16 V
HIGH POWER IMPEDANCE MEASURED VALUE: 62 Ohm
HighPow Impedance: 62 Ohm
Implantable Lead Model: 7122
Implantable Pulse Generator Implant Date: 20180329
Lead Channel Impedance Value: 410 Ohm
Lead Channel Pacing Threshold Pulse Width: 0.5 ms
Lead Channel Sensing Intrinsic Amplitude: 3.7 mV
Lead Channel Setting Pacing Pulse Width: 0.5 ms
MDC IDC LEAD IMPLANT DT: 20090507
MDC IDC LEAD LOCATION: 753860
MDC IDC MSMT LEADCHNL RV PACING THRESHOLD AMPLITUDE: 1.25 V
MDC IDC PG SERIAL: 7410740
MDC IDC SESS DTM: 20190114070015
MDC IDC SET LEADCHNL RV PACING AMPLITUDE: 2.5 V
MDC IDC SET LEADCHNL RV SENSING SENSITIVITY: 0.5 mV
MDC IDC STAT BRADY RV PERCENT PACED: 1 %

## 2017-06-04 ENCOUNTER — Encounter: Payer: Self-pay | Admitting: Cardiology

## 2017-06-04 ENCOUNTER — Other Ambulatory Visit: Payer: Self-pay | Admitting: Internal Medicine

## 2017-06-04 DIAGNOSIS — I10 Essential (primary) hypertension: Secondary | ICD-10-CM

## 2017-08-30 ENCOUNTER — Ambulatory Visit (INDEPENDENT_AMBULATORY_CARE_PROVIDER_SITE_OTHER): Payer: Commercial Managed Care - PPO | Admitting: *Deleted

## 2017-08-30 ENCOUNTER — Telehealth: Payer: Self-pay | Admitting: Cardiology

## 2017-08-30 DIAGNOSIS — I469 Cardiac arrest, cause unspecified: Secondary | ICD-10-CM

## 2017-08-30 NOTE — Telephone Encounter (Signed)
LMOVM reminding pt to send remote transmission.   

## 2017-08-31 NOTE — Progress Notes (Signed)
Remote ICD transmission.   

## 2017-09-01 ENCOUNTER — Encounter: Payer: Self-pay | Admitting: Cardiology

## 2017-09-06 LAB — CUP PACEART REMOTE DEVICE CHECK
Battery Remaining Percentage: 89 %
Battery Voltage: 3.11 V
Brady Statistic RV Percent Paced: 1 %
Date Time Interrogation Session: 20190416000523
HIGH POWER IMPEDANCE MEASURED VALUE: 61 Ohm
HIGH POWER IMPEDANCE MEASURED VALUE: 61 Ohm
Implantable Lead Implant Date: 20090507
Implantable Pulse Generator Implant Date: 20180329
Lead Channel Impedance Value: 430 Ohm
Lead Channel Pacing Threshold Amplitude: 1.25 V
Lead Channel Pacing Threshold Pulse Width: 0.5 ms
Lead Channel Sensing Intrinsic Amplitude: 4.2 mV
Lead Channel Setting Pacing Amplitude: 2.5 V
MDC IDC LEAD LOCATION: 753860
MDC IDC MSMT BATTERY REMAINING LONGEVITY: 93 mo
MDC IDC PG SERIAL: 7410740
MDC IDC SET LEADCHNL RV PACING PULSEWIDTH: 0.5 ms
MDC IDC SET LEADCHNL RV SENSING SENSITIVITY: 0.5 mV

## 2017-11-29 ENCOUNTER — Ambulatory Visit (INDEPENDENT_AMBULATORY_CARE_PROVIDER_SITE_OTHER): Payer: BLUE CROSS/BLUE SHIELD | Admitting: *Deleted

## 2017-11-29 DIAGNOSIS — I469 Cardiac arrest, cause unspecified: Secondary | ICD-10-CM | POA: Diagnosis not present

## 2017-11-30 LAB — CUP PACEART REMOTE DEVICE CHECK
Brady Statistic RV Percent Paced: 1 %
Date Time Interrogation Session: 20190715060022
HighPow Impedance: 63 Ohm
HighPow Impedance: 63 Ohm
Implantable Lead Implant Date: 20090507
Implantable Pulse Generator Implant Date: 20180329
Lead Channel Impedance Value: 410 Ohm
Lead Channel Pacing Threshold Amplitude: 1.25 V
Lead Channel Pacing Threshold Pulse Width: 0.5 ms
Lead Channel Setting Pacing Pulse Width: 0.5 ms
Lead Channel Setting Sensing Sensitivity: 0.5 mV
MDC IDC LEAD LOCATION: 753860
MDC IDC MSMT BATTERY REMAINING LONGEVITY: 92 mo
MDC IDC MSMT BATTERY REMAINING PERCENTAGE: 88 %
MDC IDC MSMT BATTERY VOLTAGE: 3.05 V
MDC IDC MSMT LEADCHNL RV SENSING INTR AMPL: 4.7 mV
MDC IDC SET LEADCHNL RV PACING AMPLITUDE: 2.5 V
Pulse Gen Serial Number: 7410740

## 2017-11-30 NOTE — Progress Notes (Signed)
Remote ICD transmission.   

## 2017-12-01 ENCOUNTER — Encounter: Payer: Self-pay | Admitting: Cardiology

## 2018-02-28 ENCOUNTER — Ambulatory Visit (INDEPENDENT_AMBULATORY_CARE_PROVIDER_SITE_OTHER): Payer: BLUE CROSS/BLUE SHIELD | Admitting: *Deleted

## 2018-02-28 DIAGNOSIS — I469 Cardiac arrest, cause unspecified: Secondary | ICD-10-CM

## 2018-03-01 NOTE — Progress Notes (Signed)
Remote ICD transmission.   

## 2018-03-03 ENCOUNTER — Encounter: Payer: Self-pay | Admitting: Cardiology

## 2018-04-27 ENCOUNTER — Encounter: Payer: Self-pay | Admitting: Internal Medicine

## 2018-04-27 ENCOUNTER — Encounter (INDEPENDENT_AMBULATORY_CARE_PROVIDER_SITE_OTHER): Payer: Self-pay

## 2018-04-27 ENCOUNTER — Ambulatory Visit (INDEPENDENT_AMBULATORY_CARE_PROVIDER_SITE_OTHER): Payer: BLUE CROSS/BLUE SHIELD | Admitting: Internal Medicine

## 2018-04-27 VITALS — BP 126/68 | HR 54 | Ht 70.0 in | Wt 170.0 lb

## 2018-04-27 DIAGNOSIS — I10 Essential (primary) hypertension: Secondary | ICD-10-CM

## 2018-04-27 DIAGNOSIS — Z95810 Presence of automatic (implantable) cardiac defibrillator: Secondary | ICD-10-CM | POA: Diagnosis not present

## 2018-04-27 DIAGNOSIS — I4901 Ventricular fibrillation: Secondary | ICD-10-CM | POA: Diagnosis not present

## 2018-04-27 NOTE — Addendum Note (Signed)
Addended by: Micki Riley C on: 04/27/2018 04:06 PM   Modules accepted: Orders

## 2018-04-27 NOTE — Patient Instructions (Signed)
Medication Instructions:  Your physician recommends that you continue on your current medications as directed. Please refer to the Current Medication list given to you today.  Labwork: None ordered.  Testing/Procedures: None ordered.  Follow-Up: Your physician wants you to follow-up in: one year with Dr. Ladona Ridgel.   You will receive a reminder letter in the mail two months in advance. If you don't receive a letter, please call our office to schedule the follow-up appointment.  Remote monitoring is used to monitor your ICD from home. This monitoring reduces the number of office visits required to check your device to one time per year. It allows [place] to keep an eye on the functioning of your device to ensure it is working properly. You are scheduled for a device check from home on 05/30/2018. You may send your transmission at any time that day. If you have a wireless device, the transmission will be sent automatically. After your physician reviews your transmission, you will receive a postcard with your next transmission date.  Any Other Special Instructions Will Be Listed Below (If Applicable).  If you need a refill on your cardiac medications before your next appointment, please call your pharmacy.

## 2018-04-27 NOTE — Progress Notes (Signed)
HPI Mr. Takeshita returns today for followup. 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 He has not had any recurrent ventricular arrhythmias on beta blocker therapy. In the interim, he has done well. He is exercising regularly. No ICD therapies. He is tolerating his beta blocker. No Known Allergies   Current Outpatient Medications  Medication Sig Dispense Refill  . aspirin 81 MG tablet Take 81 mg by mouth daily.      . fish oil-omega-3 fatty acids 1000 MG capsule Take 1 capsule by mouth daily.     . metoprolol tartrate (LOPRESSOR) 25 MG tablet TAKE ONE TABLET BY MOUTH TWICE DAILY 180 tablet 2  . Multiple Vitamins-Minerals (MULTIVITAMIN WITH MINERALS) tablet Take 1 tablet by mouth daily.    . Potassium 95 MG TABS Take 1 tablet by mouth daily.     No current facility-administered medications for this visit.      Past Medical History:  Diagnosis Date  . Allergic rhinitis, seasonal   . Cardiac arrest (HCC)   . Cardiac arrest - ventricular fibrillation     ROS:   All systems reviewed and negative except as noted in the HPI.   Past Surgical History:  Procedure Laterality Date  . CARDIAC DEFIBRILLATOR PLACEMENT     ICD St Jude  . ICD GENERATOR CHANGEOUT N/A 08/13/2016   Procedure: ICD Generator Changeout;  Surgeon: Marinus Maw, MD;  Location: [facility] INVASIVE CV LAB;  Service: Cardiovascular;  Laterality: N/A;     Family History  Problem Relation Age of Onset  . Atrial fibrillation Cousin 25  . Coronary artery disease Father        cabg x2  . Heart disease Unknown        father's brother, bypass surgery/father's brother, bypass surgery  . Coronary artery disease Unknown        family history     Social History   Socioeconomic History  . Marital status: Married    Spouse name: Not on file  . Number of children: Not on file  . Years of education: Not on file  . Highest education level: Not on file    Occupational History  . Not on file  Social Needs  . Financial resource strain: Not on file  . Food insecurity:    Worry: Not on file    Inability: Not on file  . Transportation needs:    Medical: Not on file    Non-medical: Not on file  Tobacco Use  . Smoking status: Never Smoker  . Smokeless tobacco: Never Used  Substance and Sexual Activity  . Alcohol use: No  . Drug use: No  . Sexual activity: Not on file  Lifestyle  . Physical activity:    Days per week: Not on file    Minutes per session: Not on file  . Stress: Not on file  Relationships  . Social connections:    Talks on phone: Not on file    Gets together: Not on file    Attends religious service: Not on file    Active member of club or organization: Not on file    Attends meetings of clubs or organizations: Not on file    Relationship status: Not on file  . Intimate partner violence:    Fear of current or ex partner: Not on file    Emotionally abused: Not on file    Physically abused:  Not on file    Forced sexual activity: Not on file  Other Topics Concern  . Not on file  Social History Narrative  . Not on file     BP 126/68   Pulse (!) 54   Ht 5\' 10"  (1.778 m)   Wt 170 lb (77.1 kg)   BMI 24.39 kg/m   Physical Exam:  Well appearing NAD HEENT: Unremarkable Neck:  No JVD, no thyromegally Lymphatics:  No adenopathy Back:  No CVA tenderness Lungs:  Clear with no wheezes HEART:  Regular rate rhythm, no murmurs, no rubs, no clicks Abd:  soft, positive bowel sounds, no organomegally, no rebound, no guarding Ext:  2 plus pulses, no edema, no cyanosis, no clubbing Skin:  No rashes no nodules Neuro:  CN II through XII intact, motor grossly intact  EKG - nsr with normal axis and intervals.  DEVICE  Normal device function.  See PaceArt for details.   Assess/Plan: 1. VF - he has had no recurrent symptoms on his beta blockers. Question is what led to his cardiac arrest. He had prolongation of his QT  when he initially presented but now QT is actually a little short. I will enquire about genetic testing. 2. ICD - his St. Jude single chamber ICD is working normally. 3. HTN - his blood pressure is controlled. No change in his medical therapy.  Brian Arellano.D.

## 2018-05-06 LAB — CUP PACEART INCLINIC DEVICE CHECK
Date Time Interrogation Session: 20191220143932
Implantable Lead Model: 7122
Implantable Pulse Generator Implant Date: 20180329
MDC IDC LEAD IMPLANT DT: 20090507
MDC IDC LEAD LOCATION: 753860
MDC IDC PG SERIAL: 7410740

## 2018-05-07 LAB — CUP PACEART REMOTE DEVICE CHECK
Battery Remaining Longevity: 89 mo
Battery Remaining Percentage: 85 %
HIGH POWER IMPEDANCE MEASURED VALUE: 62 Ohm
HIGH POWER IMPEDANCE MEASURED VALUE: 62 Ohm
Implantable Lead Location: 753860
Implantable Pulse Generator Implant Date: 20180329
Lead Channel Impedance Value: 400 Ohm
Lead Channel Pacing Threshold Pulse Width: 0.5 ms
Lead Channel Sensing Intrinsic Amplitude: 3.7 mV
Lead Channel Setting Pacing Pulse Width: 0.5 ms
MDC IDC LEAD IMPLANT DT: 20090507
MDC IDC MSMT BATTERY VOLTAGE: 3.04 V
MDC IDC MSMT LEADCHNL RV PACING THRESHOLD AMPLITUDE: 1.25 V
MDC IDC PG SERIAL: 7410740
MDC IDC SESS DTM: 20191014070812
MDC IDC SET LEADCHNL RV PACING AMPLITUDE: 2.5 V
MDC IDC SET LEADCHNL RV SENSING SENSITIVITY: 0.5 mV
MDC IDC STAT BRADY RV PERCENT PACED: 1 %

## 2018-05-30 ENCOUNTER — Ambulatory Visit (INDEPENDENT_AMBULATORY_CARE_PROVIDER_SITE_OTHER): Payer: BLUE CROSS/BLUE SHIELD

## 2018-05-30 DIAGNOSIS — I469 Cardiac arrest, cause unspecified: Secondary | ICD-10-CM

## 2018-05-31 NOTE — Progress Notes (Signed)
Remote ICD transmission.   

## 2018-06-03 LAB — CUP PACEART REMOTE DEVICE CHECK
Battery Remaining Longevity: 87 mo
Battery Remaining Percentage: 84 %
Battery Voltage: 3.01 V
Brady Statistic RV Percent Paced: 1 %
HIGH POWER IMPEDANCE MEASURED VALUE: 62 Ohm
HIGH POWER IMPEDANCE MEASURED VALUE: 62 Ohm
Implantable Lead Implant Date: 20090507
Implantable Lead Model: 7122
Implantable Pulse Generator Implant Date: 20180329
Lead Channel Impedance Value: 400 Ohm
Lead Channel Pacing Threshold Pulse Width: 0.6 ms
Lead Channel Sensing Intrinsic Amplitude: 12 mV
Lead Channel Setting Pacing Amplitude: 2.5 V
Lead Channel Setting Pacing Pulse Width: 0.6 ms
MDC IDC LEAD LOCATION: 753860
MDC IDC MSMT LEADCHNL RV PACING THRESHOLD AMPLITUDE: 1.25 V
MDC IDC PG SERIAL: 7410740
MDC IDC SESS DTM: 20200113073637
MDC IDC SET LEADCHNL RV SENSING SENSITIVITY: 0.5 mV

## 2018-08-29 ENCOUNTER — Other Ambulatory Visit: Payer: Self-pay

## 2018-08-29 ENCOUNTER — Ambulatory Visit (INDEPENDENT_AMBULATORY_CARE_PROVIDER_SITE_OTHER): Payer: BLUE CROSS/BLUE SHIELD | Admitting: *Deleted

## 2018-08-29 DIAGNOSIS — I469 Cardiac arrest, cause unspecified: Secondary | ICD-10-CM | POA: Diagnosis not present

## 2018-08-29 LAB — CUP PACEART REMOTE DEVICE CHECK
Date Time Interrogation Session: 20200413181459
Implantable Lead Implant Date: 20090507
Implantable Lead Location: 753860
Implantable Lead Model: 7122
Implantable Pulse Generator Implant Date: 20180329
Pulse Gen Serial Number: 7410740

## 2018-09-09 ENCOUNTER — Encounter: Payer: Self-pay | Admitting: Cardiology

## 2018-09-09 NOTE — Progress Notes (Signed)
Remote ICD transmission.   

## 2018-10-18 ENCOUNTER — Other Ambulatory Visit: Payer: Self-pay | Admitting: Internal Medicine

## 2018-10-18 DIAGNOSIS — I10 Essential (primary) hypertension: Secondary | ICD-10-CM

## 2018-11-28 ENCOUNTER — Ambulatory Visit (INDEPENDENT_AMBULATORY_CARE_PROVIDER_SITE_OTHER): Payer: BC Managed Care – PPO | Admitting: *Deleted

## 2018-11-28 DIAGNOSIS — I469 Cardiac arrest, cause unspecified: Secondary | ICD-10-CM

## 2018-11-28 LAB — CUP PACEART REMOTE DEVICE CHECK
Battery Remaining Longevity: 82 mo
Battery Remaining Percentage: 80 %
Battery Voltage: 2.99 V
Brady Statistic RV Percent Paced: 1 %
Date Time Interrogation Session: 20200713060031
HighPow Impedance: 62 Ohm
HighPow Impedance: 62 Ohm
Implantable Lead Implant Date: 20090507
Implantable Lead Location: 753860
Implantable Lead Model: 7122
Implantable Pulse Generator Implant Date: 20180329
Lead Channel Impedance Value: 390 Ohm
Lead Channel Pacing Threshold Amplitude: 1.25 V
Lead Channel Pacing Threshold Pulse Width: 0.6 ms
Lead Channel Sensing Intrinsic Amplitude: 4.7 mV
Lead Channel Setting Pacing Amplitude: 2.5 V
Lead Channel Setting Pacing Pulse Width: 0.6 ms
Lead Channel Setting Sensing Sensitivity: 0.5 mV
Pulse Gen Serial Number: 7410740

## 2018-12-12 NOTE — Progress Notes (Signed)
Remote ICD transmission.   

## 2019-02-06 ENCOUNTER — Telehealth: Payer: Self-pay | Admitting: *Deleted

## 2019-02-06 NOTE — Telephone Encounter (Signed)
[employer] alert received for RV lead noise reversions. 2 noise reversions noted. 5 NSVT episodes also show noise. Sensing and impedance trends stable.  Will need clinic visit to assess further.  LMOVM requesting call back from patient. Direct number given. Also tried wife's number--VM full, unable to LM.

## 2019-02-07 NOTE — Telephone Encounter (Signed)
Second attempt:  Attempted wife's number--straight to VM, VM full. LM on patient's VM requesting call back to DC.

## 2019-02-08 NOTE — Telephone Encounter (Signed)
Third attempt:  Attempted wife's number--straight to VM, VM full. LM on patient's VM requesting call back to DC.

## 2019-02-09 ENCOUNTER — Encounter: Payer: Self-pay | Admitting: *Deleted

## 2019-02-09 NOTE — Telephone Encounter (Signed)
Letter printed and will be mailed 02/10/19.

## 2019-02-12 NOTE — Telephone Encounter (Signed)
Noted. Brian Arellano

## 2019-02-15 NOTE — Telephone Encounter (Addendum)
Attempted to reach patient at work number due to ongoing alerts for RV lead noise--unable to [employer] via phone. LMOVM for patient and LMOVM for patient's wife. Gave direct number for return call.  Certified letter mailed today.

## 2019-02-17 ENCOUNTER — Encounter: Payer: Self-pay | Admitting: *Deleted

## 2019-02-17 NOTE — Telephone Encounter (Signed)
Correction:  Certified letter mailed 02/17/2019.

## 2019-02-26 LAB — CUP PACEART REMOTE DEVICE CHECK
Battery Remaining Longevity: 80 mo
Battery Remaining Percentage: 78 %
Battery Voltage: 2.99 V
Brady Statistic RV Percent Paced: 1 %
Date Time Interrogation Session: 20201011070826
HighPow Impedance: 63 Ohm
HighPow Impedance: 63 Ohm
Implantable Lead Implant Date: 20090507
Implantable Lead Location: 753860
Implantable Lead Model: 7122
Implantable Pulse Generator Implant Date: 20180329
Lead Channel Impedance Value: 360 Ohm
Lead Channel Pacing Threshold Amplitude: 1.25 V
Lead Channel Pacing Threshold Pulse Width: 0.6 ms
Lead Channel Sensing Intrinsic Amplitude: 9.7 mV
Lead Channel Setting Pacing Amplitude: 2.5 V
Lead Channel Setting Pacing Pulse Width: 0.6 ms
Lead Channel Setting Sensing Sensitivity: 0.5 mV
Pulse Gen Serial Number: 7410740

## 2019-02-28 ENCOUNTER — Ambulatory Visit (INDEPENDENT_AMBULATORY_CARE_PROVIDER_SITE_OTHER): Payer: BC Managed Care – PPO | Admitting: *Deleted

## 2019-02-28 DIAGNOSIS — I469 Cardiac arrest, cause unspecified: Secondary | ICD-10-CM

## 2019-03-01 LAB — CUP PACEART REMOTE DEVICE CHECK
Battery Remaining Longevity: 80 mo
Battery Remaining Percentage: 77 %
Battery Voltage: 2.99 V
Brady Statistic RV Percent Paced: 1 %
Date Time Interrogation Session: 20201014060016
HighPow Impedance: 59 Ohm
HighPow Impedance: 59 Ohm
Implantable Lead Implant Date: 20090507
Implantable Lead Location: 753860
Implantable Lead Model: 7122
Implantable Pulse Generator Implant Date: 20180329
Lead Channel Impedance Value: 350 Ohm
Lead Channel Pacing Threshold Amplitude: 1.25 V
Lead Channel Pacing Threshold Pulse Width: 0.6 ms
Lead Channel Sensing Intrinsic Amplitude: 5.9 mV
Lead Channel Setting Pacing Amplitude: 2.5 V
Lead Channel Setting Pacing Pulse Width: 0.6 ms
Lead Channel Setting Sensing Sensitivity: 0.5 mV
Pulse Gen Serial Number: 7410740

## 2019-03-10 NOTE — Progress Notes (Signed)
Remote ICD transmission.   

## 2019-03-21 ENCOUNTER — Telehealth: Payer: Self-pay | Admitting: Emergency Medicine

## 2019-03-21 NOTE — Telephone Encounter (Signed)
LMOM.  Attempting to contact patient about noise on RV lead. No response to certified letter. 1 year f/u scheduled 05/09/19 with Dr Lovena Le.

## 2019-03-22 NOTE — Telephone Encounter (Signed)
Certified letter mailed 02/17/19 delivered 02/23/19 and returned signed. No response from patient. Will address at follow-up on 05/09/19.

## 2019-05-09 ENCOUNTER — Other Ambulatory Visit: Payer: Self-pay

## 2019-05-09 ENCOUNTER — Encounter: Payer: Self-pay | Admitting: Internal Medicine

## 2019-05-09 ENCOUNTER — Ambulatory Visit (INDEPENDENT_AMBULATORY_CARE_PROVIDER_SITE_OTHER): Payer: BC Managed Care – PPO | Admitting: Internal Medicine

## 2019-05-09 VITALS — BP 112/78 | HR 63 | Ht 70.0 in | Wt 177.6 lb

## 2019-05-09 DIAGNOSIS — I4901 Ventricular fibrillation: Secondary | ICD-10-CM

## 2019-05-09 DIAGNOSIS — Z95810 Presence of automatic (implantable) cardiac defibrillator: Secondary | ICD-10-CM | POA: Diagnosis not present

## 2019-05-09 DIAGNOSIS — T82110A Breakdown (mechanical) of cardiac electrode, initial encounter: Secondary | ICD-10-CM | POA: Diagnosis not present

## 2019-05-09 NOTE — Progress Notes (Signed)
HPI [name] returns today for followup. He is a pleasant 43 yo man with primary VF s/p ICD insertion who underwent gen change out a couple of years ago. He has not had any recent ICD therapies. Interrogation of his ICD demonstrated noise on his lead and he presents today to discuss treatment options. He has not received any recent ICD therapies. He has felt well and denies any trauma to the chest. He does life weights.  No Known Allergies   Current Outpatient Medications  Medication Sig Dispense Refill  . aspirin 81 MG tablet Take 81 mg by mouth daily.      . fish oil-omega-3 fatty acids 1000 MG capsule Take 1 capsule by mouth daily.     . metoprolol tartrate (LOPRESSOR) 25 MG tablet Take 1 tablet by mouth twice daily 180 tablet 3  . Multiple Vitamins-Minerals (MULTIVITAMIN WITH MINERALS) tablet Take 1 tablet by mouth daily.    . Potassium 95 MG TABS Take 1 tablet by mouth daily.     No current facility-administered medications for this visit.     Past Medical History:  Diagnosis Date  . Allergic rhinitis, seasonal   . Cardiac arrest (HCC)   . Cardiac arrest - ventricular fibrillation     ROS:   All systems reviewed and negative except as noted in the HPI.   Past Surgical History:  Procedure Laterality Date  . CARDIAC DEFIBRILLATOR PLACEMENT     ICD St Jude  . ICD GENERATOR CHANGEOUT N/A 08/13/2016   Procedure: ICD Generator Changeout;  Surgeon: Marinus Maw, MD;  Location: [facility] INVASIVE CV LAB;  Service: Cardiovascular;  Laterality: N/A;     Family History  Problem Relation Age of Onset  . Atrial fibrillation Cousin 25  . Coronary artery disease Father        cabg x2  . Heart disease Unknown        father's brother, bypass surgery/father's brother, bypass surgery  . Coronary artery disease Unknown        family history     Social History   Socioeconomic History  . Marital status: Married    Spouse name: Not on file  . Number of children: Not on  file  . Years of education: Not on file  . Highest education level: Not on file  Occupational History  . Not on file  Tobacco Use  . Smoking status: Never Smoker  . Smokeless tobacco: Never Used  Substance and Sexual Activity  . Alcohol use: No  . Drug use: No  . Sexual activity: Not on file  Other Topics Concern  . Not on file  Social History Narrative  . Not on file   Social Determinants of Health   Financial Resource Strain:   . Difficulty of Paying Living Expenses: Not on file  Food Insecurity:   . Worried About Programme researcher, broadcasting/film/video in the Last Year: Not on file  . Ran Out of Food in the Last Year: Not on file  Transportation Needs:   . Lack of Transportation (Medical): Not on file  . Lack of Transportation (Non-Medical): Not on file  Physical Activity:   . Days of Exercise per Week: Not on file  . Minutes of Exercise per Session: Not on file  Stress:   . Feeling of Stress : Not on file  Social Connections:   . Frequency of Communication with Friends and Family: Not on file  . Frequency of Social Gatherings with Friends  and Family: Not on file  . Attends Religious Services: Not on file  . Active Member of Clubs or Organizations: Not on file  . Attends Archivist Meetings: Not on file  . Marital Status: Not on file  Intimate Partner Violence:   . Fear of Current or Ex-Partner: Not on file  . Emotionally Abused: Not on file  . Physically Abused: Not on file  . Sexually Abused: Not on file     BP 112/78   Pulse 63   Ht 5\' 10"  (1.778 m)   Wt 177 lb 9.6 oz (80.6 kg)   SpO2 98%   BMI 25.48 kg/m   Physical Exam:  Well appearing 43 yo man, NAD HEENT: Unremarkable Neck:  No JVD, no thyromegally Lymphatics:  No adenopathy Back:  No CVA tenderness Lungs:  Clear with no wheezes HEART:  Regular rate rhythm, no murmurs, no rubs, no clicks Abd:  soft, positive bowel sounds, no organomegally, no rebound, no guarding Ext:  2 plus pulses, no edema, no  cyanosis, no clubbing Skin:  No rashes no nodules Neuro:  CN II through XII intact, motor grossly intact  EKG NSR  DEVICE  Normal device function.  See PaceArt for details.  Reproducible noise is present on rate sense lead  Assess/Plan: 1. ICD lead noise - I have discussed the treatment options with the patient. He will undergo ICD system extraction due to his young age with insertion of a new ICD lead. I have discussed the alternatives as well as the risk/benefits/goals/expectations of the procedure with the patient and he wishes to proceed. I spent over 25 minutes including 50% face to face time.  Mikle Bosworth.D.

## 2019-05-09 NOTE — H&P (View-Only) (Signed)
HPI Mr. Cokley returns today for followup. He is a pleasant 43 yo man with primary VF s/p ICD insertion who underwent gen change out a couple of years ago. He has not had any recent ICD therapies. Interrogation of his ICD demonstrated noise on his lead and he presents today to discuss treatment options. He has not received any recent ICD therapies. He has felt well and denies any trauma to the chest. He does life weights.  No Known Allergies   Current Outpatient Medications  Medication Sig Dispense Refill  . aspirin 81 MG tablet Take 81 mg by mouth daily.      . fish oil-omega-3 fatty acids 1000 MG capsule Take 1 capsule by mouth daily.     . metoprolol tartrate (LOPRESSOR) 25 MG tablet Take 1 tablet by mouth twice daily 180 tablet 3  . Multiple Vitamins-Minerals (MULTIVITAMIN WITH MINERALS) tablet Take 1 tablet by mouth daily.    . Potassium 95 MG TABS Take 1 tablet by mouth daily.     No current facility-administered medications for this visit.     Past Medical History:  Diagnosis Date  . Allergic rhinitis, seasonal   . Cardiac arrest (HCC)   . Cardiac arrest - ventricular fibrillation     ROS:   All systems reviewed and negative except as noted in the HPI.   Past Surgical History:  Procedure Laterality Date  . CARDIAC DEFIBRILLATOR PLACEMENT     ICD St Jude  . ICD GENERATOR CHANGEOUT N/A 08/13/2016   Procedure: ICD Generator Changeout;  Surgeon: Marinus Maw, MD;  Location: [facility] INVASIVE CV LAB;  Service: Cardiovascular;  Laterality: N/A;     Family History  Problem Relation Age of Onset  . Atrial fibrillation Cousin 25  . Coronary artery disease Father        cabg x2  . Heart disease Unknown        father's brother, bypass surgery/father's brother, bypass surgery  . Coronary artery disease Unknown        family history     Social History   Socioeconomic History  . Marital status: Married    Spouse name: Not on file  . Number of children: Not on  file  . Years of education: Not on file  . Highest education level: Not on file  Occupational History  . Not on file  Tobacco Use  . Smoking status: Never Smoker  . Smokeless tobacco: Never Used  Substance and Sexual Activity  . Alcohol use: No  . Drug use: No  . Sexual activity: Not on file  Other Topics Concern  . Not on file  Social History Narrative  . Not on file   Social Determinants of Health   Financial Resource Strain:   . Difficulty of Paying Living Expenses: Not on file  Food Insecurity:   . Worried About Programme researcher, broadcasting/film/video in the Last Year: Not on file  . Ran Out of Food in the Last Year: Not on file  Transportation Needs:   . Lack of Transportation (Medical): Not on file  . Lack of Transportation (Non-Medical): Not on file  Physical Activity:   . Days of Exercise per Week: Not on file  . Minutes of Exercise per Session: Not on file  Stress:   . Feeling of Stress : Not on file  Social Connections:   . Frequency of Communication with Friends and Family: Not on file  . Frequency of Social Gatherings with Friends  and Family: Not on file  . Attends Religious Services: Not on file  . Active Member of Clubs or Organizations: Not on file  . Attends Archivist Meetings: Not on file  . Marital Status: Not on file  Intimate Partner Violence:   . Fear of Current or Ex-Partner: Not on file  . Emotionally Abused: Not on file  . Physically Abused: Not on file  . Sexually Abused: Not on file     BP 112/78   Pulse 63   Ht 5\' 10"  (1.778 m)   Wt 177 lb 9.6 oz (80.6 kg)   SpO2 98%   BMI 25.48 kg/m   Physical Exam:  Well appearing 43 yo man, NAD HEENT: Unremarkable Neck:  No JVD, no thyromegally Lymphatics:  No adenopathy Back:  No CVA tenderness Lungs:  Clear with no wheezes HEART:  Regular rate rhythm, no murmurs, no rubs, no clicks Abd:  soft, positive bowel sounds, no organomegally, no rebound, no guarding Ext:  2 plus pulses, no edema, no  cyanosis, no clubbing Skin:  No rashes no nodules Neuro:  CN II through XII intact, motor grossly intact  EKG NSR  DEVICE  Normal device function.  See PaceArt for details.  Reproducible noise is present on rate sense lead  Assess/Plan: 1. ICD lead noise - I have discussed the treatment options with the patient. He will undergo ICD system extraction due to his young age with insertion of a new ICD lead. I have discussed the alternatives as well as the risk/benefits/goals/expectations of the procedure with the patient and he wishes to proceed. I spent over 25 minutes including 50% face to face time.  Mikle Bosworth.D.

## 2019-05-09 NOTE — Patient Instructions (Addendum)
Medication Instructions:  Your physician recommends that you continue on your current medications as directed. Please refer to the Current Medication list given to you today.  Labwork: You will get lab work today:  BMP and CBC  Testing/Procedures: None ordered.  Follow-Up:  I am going to attempt to schedule your procedure for one of the following days:  January 6, 8 or 11  Any Other Special Instructions Will Be Listed Below (If Applicable).  If you need a refill on your cardiac medications before your next appointment, please call your pharmacy.

## 2019-05-10 ENCOUNTER — Telehealth: Payer: Self-pay

## 2019-05-10 LAB — BASIC METABOLIC PANEL
BUN/Creatinine Ratio: 16 (ref 9–20)
BUN: 17 mg/dL (ref 6–24)
CO2: 27 mmol/L (ref 20–29)
Calcium: 9.9 mg/dL (ref 8.7–10.2)
Chloride: 101 mmol/L (ref 96–106)
Creatinine, Ser: 1.09 mg/dL (ref 0.76–1.27)
GFR calc Af Amer: 95 mL/min/{1.73_m2} (ref >59)
GFR calc non Af Amer: 82 mL/min/{1.73_m2} (ref >59)
Glucose: 98 mg/dL (ref 65–99)
Potassium: 5.7 mmol/L — ABNORMAL HIGH (ref 3.5–5.2)
Sodium: 139 mmol/L (ref 134–144)

## 2019-05-10 LAB — CBC WITH DIFFERENTIAL/PLATELET
Basophils Absolute: 0.1 10*3/uL (ref 0.0–0.2)
Basos: 1 %
EOS (ABSOLUTE): 0.1 10*3/uL (ref 0.0–0.4)
Eos: 2 %
Hematocrit: 46.8 % (ref 37.5–51.0)
Hemoglobin: 16.2 g/dL (ref 13.0–17.7)
Immature Grans (Abs): 0 10*3/uL (ref 0.0–0.1)
Immature Granulocytes: 0 %
Lymphocytes Absolute: 1.9 10*3/uL (ref 0.7–3.1)
Lymphs: 23 %
MCH: 30.7 pg (ref 26.6–33.0)
MCHC: 34.6 g/dL (ref 31.5–35.7)
MCV: 89 fL (ref 79–97)
Monocytes Absolute: 0.6 10*3/uL (ref 0.1–0.9)
Monocytes: 8 %
Neutrophils Absolute: 5.4 10*3/uL (ref 1.4–7.0)
Neutrophils: 66 %
Platelets: 153 10*3/uL (ref 150–450)
RBC: 5.27 x10E6/uL (ref 4.14–5.80)
RDW: 13.5 % (ref 11.6–15.4)
WBC: 8.1 10*3/uL (ref 3.4–10.8)

## 2019-05-10 NOTE — Telephone Encounter (Signed)
Left detailed message for Pt advising procedure was scheduled for May 24, 2019 at 3:00 pm.  Advised to decrease potassium to every other day until further advisement from GT d/t elevated potassium.  Covid test scheduled for 1/4 at 8:00 am  Please arrive at the [facility] main entrance of [place] [facility] at:  05/24/2019 at 1:00 pm  Do not eat or drink after midnight prior to procedure  May take all am meds prior to procedure with a sip of water  Plan for one night stay  You will need someone to drive you home at discharge  F/u will be scheduled.  Need to confirm with Pt.

## 2019-05-11 NOTE — Telephone Encounter (Signed)
Left message on wife's phone requesting call back to confirm procedure/instructions.

## 2019-05-15 NOTE — Telephone Encounter (Signed)
Left message to call back  

## 2019-05-16 NOTE — Telephone Encounter (Signed)
Left message for Pt and wife.  Advised if no response shortly (day or 2) will cancel this procedure.  Left number of [facility] to call back.

## 2019-05-18 ENCOUNTER — Encounter (HOSPITAL_COMMUNITY): Payer: Self-pay

## 2019-05-18 NOTE — Progress Notes (Signed)
[place] [place], [address] [place] [address] Phone: [phone] Fax: [phone]      Your procedure is scheduled on January 6th.  Report to [facility] Main Entrance "A" at 1:00 P.M., and check in at the Admitting office.  Call this number if you have problems the morning of surgery:  [phone]  Call [phone] if you have any questions prior to your surgery date Monday-Friday 8am-4pm    Remember:  Do not eat or drink after midnight the night before your surgery     Take these medicines the morning of surgery with A SIP OF WATER   Metoprolol  Follow your surgeon's instructions on when to stop Aspirin.  If no instructions were given by your surgeon then you will need to call the office to get those instructions.    7 days prior to surgery STOP taking Aleve, Naproxen, Ibuprofen, Motrin, Advil, Goody's, BC's, all herbal medications, fish oil, and all vitamins.    The Morning of Surgery  Do not wear jewelry.  Do not wear lotions, powders, colognes, or deodorant  Men may shave face and neck.  Do not bring valuables to the hospital.  [facility] is not responsible for any belongings or valuables.  If you are a smoker, DO NOT Smoke 24 hours prior to surgery  If you wear a CPAP at night please bring your mask, tubing, and machine the morning of surgery   Remember that you must have someone to transport you home after your surgery, and remain with you for 24 hours if you are discharged the same day.   Please bring cases for contacts, glasses, hearing aids, dentures or bridgework because it cannot be worn into surgery.    Leave your suitcase in the car.  After surgery it may be brought to your room.  For patients admitted to the hospital, discharge time will be determined by your treatment team.  Patients discharged the day of surgery will not be allowed to drive home.    Special instructions:   [facility]- Preparing For Surgery  Before surgery, you can play an important role. Because skin is not sterile, your skin needs to be as free of germs as possible. You can reduce the number of germs on your skin by washing with CHG (chlorahexidine gluconate) Soap before surgery.  CHG is an antiseptic cleaner which kills germs and bonds with the skin to continue killing germs even after washing.    Oral Hygiene is also important to reduce your risk of infection.  Remember - BRUSH YOUR TEETH THE MORNING OF SURGERY WITH YOUR REGULAR TOOTHPASTE  Please do not use if you have an allergy to CHG or antibacterial soaps. If your skin becomes reddened/irritated stop using the CHG.  Do not shave (including legs and underarms) for at least 48 hours prior to first CHG shower. It is OK to shave your face.  Please follow these instructions carefully.   1. Shower the NIGHT BEFORE SURGERY and the MORNING OF SURGERY with CHG Soap.   2. If you chose to wash your hair, wash your hair first as usual with your normal shampoo.  3. After you shampoo, rinse your hair and body thoroughly to remove the shampoo.  4. Use CHG as you would any other liquid soap. You can apply CHG directly to the skin and wash gently with a scrungie or a clean washcloth.   5. Apply the CHG Soap  to your body ONLY FROM THE NECK DOWN.  Do not use on open wounds or open sores. Avoid contact with your eyes, ears, mouth and genitals (private parts). Wash Face and genitals (private parts)  with your normal soap.   6. Wash thoroughly, paying special attention to the area where your surgery will be performed.  7. Thoroughly rinse your body with warm water from the neck down.  8. DO NOT shower/wash with your normal soap after using and rinsing off the CHG Soap.  9. Pat yourself dry with a CLEAN TOWEL.  10. Wear CLEAN PAJAMAS to bed the night before surgery, wear comfortable clothes the morning of surgery  11. Place CLEAN SHEETS on your bed the  night of your first shower and DO NOT SLEEP WITH PETS.    Day of Surgery:  Please shower the morning of surgery with the CHG soap Do not apply any deodorants/lotions. Please wear clean clothes to the hospital/surgery center.   Remember to brush your teeth WITH YOUR REGULAR TOOTHPASTE.   Please read over the following fact sheets that you were given.

## 2019-05-22 ENCOUNTER — Other Ambulatory Visit: Payer: Self-pay

## 2019-05-22 ENCOUNTER — Encounter (HOSPITAL_COMMUNITY)
Admission: RE | Admit: 2019-05-22 | Discharge: 2019-05-22 | Disposition: A | Discharge status: Home / Self Care | Payer: BC Managed Care – PPO | Source: Ambulatory Visit | Attending: Internal Medicine | Admitting: Internal Medicine

## 2019-05-22 ENCOUNTER — Other Ambulatory Visit (HOSPITAL_COMMUNITY)
Admission: RE | Admit: 2019-05-22 | Discharge: 2019-05-22 | Disposition: A | Discharge status: Home / Self Care | Payer: BC Managed Care – PPO | Source: Ambulatory Visit | Attending: Internal Medicine | Admitting: Internal Medicine

## 2019-05-22 ENCOUNTER — Encounter (HOSPITAL_COMMUNITY): Payer: Self-pay

## 2019-05-22 DIAGNOSIS — T82118A Breakdown (mechanical) of other cardiac electronic device, initial encounter: Secondary | ICD-10-CM | POA: Diagnosis not present

## 2019-05-22 DIAGNOSIS — Z01812 Encounter for preprocedural laboratory examination: Secondary | ICD-10-CM | POA: Insufficient documentation

## 2019-05-22 DIAGNOSIS — Z20822 Contact with and (suspected) exposure to covid-19: Secondary | ICD-10-CM | POA: Diagnosis not present

## 2019-05-22 HISTORY — DX: Presence of cardiac pacemaker: Z95.0

## 2019-05-22 LAB — CBC
HCT: 48.2 % (ref 39.0–52.0)
Hemoglobin: 16.1 g/dL (ref 13.0–17.0)
MCH: 30.3 pg (ref 26.0–34.0)
MCHC: 33.4 g/dL (ref 30.0–36.0)
MCV: 90.8 fL (ref 80.0–100.0)
Platelets: 150 10*3/uL (ref 150–400)
RBC: 5.31 MIL/uL (ref 4.22–5.81)
RDW: 13.4 % (ref 11.5–15.5)
WBC: 6.1 10*3/uL (ref 4.0–10.5)
nRBC: 0 % (ref 0.0–0.2)

## 2019-05-22 LAB — SARS CORONAVIRUS 2 (TAT 6-24 HRS): SARS Coronavirus 2: NEGATIVE

## 2019-05-22 LAB — ABO/RH: ABO/RH(D): B POS

## 2019-05-22 NOTE — Progress Notes (Signed)
PCP:  Dr. Yetta Barre at [facility] Cardiologist:  Dr. Ladona Ridgel  EKG:  05/09/19 CXR:  N/A ECHO:  denies Stress Test:  denies Cardiac Cath:  Denies  Covid test /4/21  PPM form sent.  Patient denies shortness of breath, fever, cough, and chest pain at PAT appointment.  Patient verbalized understanding of instructions provided today at the PAT appointment.  Patient asked to review instructions at home and day of surgery.

## 2019-05-24 ENCOUNTER — Inpatient Hospital Stay (HOSPITAL_COMMUNITY): Payer: BC Managed Care – PPO

## 2019-05-24 ENCOUNTER — Inpatient Hospital Stay (HOSPITAL_COMMUNITY)
Admission: RE | Admit: 2019-05-24 | Discharge: 2019-05-25 | DRG: 227 | Disposition: A | Discharge status: Home / Self Care | Payer: BC Managed Care – PPO | Attending: Internal Medicine | Admitting: Internal Medicine

## 2019-05-24 ENCOUNTER — Other Ambulatory Visit: Payer: Self-pay

## 2019-05-24 ENCOUNTER — Inpatient Hospital Stay (HOSPITAL_COMMUNITY): Payer: BC Managed Care – PPO | Admitting: Anesthesiology

## 2019-05-24 ENCOUNTER — Encounter (HOSPITAL_COMMUNITY)
Admission: RE | Disposition: A | Discharge status: Home / Self Care | Payer: Self-pay | Source: Home / Self Care | Attending: Internal Medicine

## 2019-05-24 ENCOUNTER — Encounter (HOSPITAL_COMMUNITY): Payer: Self-pay | Admitting: Internal Medicine

## 2019-05-24 DIAGNOSIS — T82190A Other mechanical complication of cardiac electrode, initial encounter: Secondary | ICD-10-CM | POA: Diagnosis present

## 2019-05-24 DIAGNOSIS — R031 Nonspecific low blood-pressure reading: Secondary | ICD-10-CM | POA: Diagnosis not present

## 2019-05-24 DIAGNOSIS — Z7982 Long term (current) use of aspirin: Secondary | ICD-10-CM | POA: Diagnosis not present

## 2019-05-24 DIAGNOSIS — Z79899 Other long term (current) drug therapy: Secondary | ICD-10-CM | POA: Diagnosis not present

## 2019-05-24 DIAGNOSIS — Z20822 Contact with and (suspected) exposure to covid-19: Secondary | ICD-10-CM | POA: Diagnosis present

## 2019-05-24 DIAGNOSIS — Y712 Prosthetic and other implants, materials and accessory cardiovascular devices associated with adverse incidents: Secondary | ICD-10-CM | POA: Diagnosis present

## 2019-05-24 DIAGNOSIS — Z8674 Personal history of sudden cardiac arrest: Secondary | ICD-10-CM

## 2019-05-24 DIAGNOSIS — T82110A Breakdown (mechanical) of cardiac electrode, initial encounter: Secondary | ICD-10-CM | POA: Diagnosis present

## 2019-05-24 DIAGNOSIS — I4901 Ventricular fibrillation: Secondary | ICD-10-CM | POA: Diagnosis not present

## 2019-05-24 DIAGNOSIS — I10 Essential (primary) hypertension: Secondary | ICD-10-CM | POA: Diagnosis present

## 2019-05-24 DIAGNOSIS — Z8249 Family history of ischemic heart disease and other diseases of the circulatory system: Secondary | ICD-10-CM

## 2019-05-24 HISTORY — PX: PACEMAKER LEAD REMOVAL: SHX5064

## 2019-05-24 LAB — PREPARE RBC (CROSSMATCH)

## 2019-05-24 LAB — BASIC METABOLIC PANEL
Anion gap: 8 (ref 5–15)
BUN: 14 mg/dL (ref 6–20)
CO2: 26 mmol/L (ref 22–32)
Calcium: 9.1 mg/dL (ref 8.9–10.3)
Chloride: 103 mmol/L (ref 98–111)
Creatinine, Ser: 1.03 mg/dL (ref 0.61–1.24)
GFR calc Af Amer: >60 mL/min (ref >60)
GFR calc non Af Amer: >60 mL/min (ref >60)
Glucose, Bld: 96 mg/dL (ref 70–99)
Potassium: 4 mmol/L (ref 3.5–5.1)
Sodium: 137 mmol/L (ref 135–145)

## 2019-05-24 LAB — ECHO INTRAOPERATIVE TEE
Height: 70 in
Weight: 2848 oz

## 2019-05-24 SURGERY — REMOVAL, ELECTRODE LEAD, CARDIAC PACEMAKER, WITHOUT REPLACEMENT
Anesthesia: General | Site: Chest | Laterality: Right

## 2019-05-24 MED ORDER — POTASSIUM 95 MG PO TABS: 95.0000 mg | ORAL_TABLET | Freq: Every day | ORAL | Status: DC

## 2019-05-24 MED ORDER — PROMETHAZINE HCL 25 MG/ML IJ SOLN: 6.2500 mg | INTRAMUSCULAR | Status: DC | PRN

## 2019-05-24 MED ORDER — FENTANYL CITRATE (PF) 250 MCG/5ML IJ SOLN
INTRAMUSCULAR | Status: DC | PRN
  Administered 2019-05-24 (×2): 100 ug via INTRAVENOUS
  Administered 2019-05-24: 50 ug via INTRAVENOUS

## 2019-05-24 MED ORDER — LACTATED RINGERS IV SOLN: INTRAVENOUS | Status: DC | PRN

## 2019-05-24 MED ORDER — EPHEDRINE 5 MG/ML INJ
INTRAVENOUS | Status: AC
  Filled 2019-05-24: qty 20

## 2019-05-24 MED ORDER — SUGAMMADEX SODIUM 200 MG/2ML IV SOLN
INTRAVENOUS | Status: DC | PRN
  Administered 2019-05-24: 200 mg via INTRAVENOUS

## 2019-05-24 MED ORDER — ROCURONIUM BROMIDE 10 MG/ML (PF) SYRINGE
PREFILLED_SYRINGE | INTRAVENOUS | Status: DC | PRN
  Administered 2019-05-24: 30 mg via INTRAVENOUS
  Administered 2019-05-24: 70 mg via INTRAVENOUS

## 2019-05-24 MED ORDER — SODIUM CHLORIDE 0.9 % IV SOLN
INTRAVENOUS | Status: AC
  Filled 2019-05-24 (×2): qty 2

## 2019-05-24 MED ORDER — ASPIRIN EC 81 MG PO TBEC
81.0000 mg | DELAYED_RELEASE_TABLET | Freq: Every day | ORAL | Status: DC
  Administered 2019-05-25: 81 mg via ORAL
  Filled 2019-05-24: qty 1

## 2019-05-24 MED ORDER — ROCURONIUM BROMIDE 10 MG/ML (PF) SYRINGE
PREFILLED_SYRINGE | INTRAVENOUS | Status: AC
  Filled 2019-05-24: qty 10

## 2019-05-24 MED ORDER — ONDANSETRON HCL 4 MG/2ML IJ SOLN
4.0000 mg | Freq: Four times a day (QID) | INTRAMUSCULAR | Status: DC | PRN
  Administered 2019-05-24: 19:00:00 4 mg via INTRAVENOUS
  Filled 2019-05-24: qty 2

## 2019-05-24 MED ORDER — SODIUM CHLORIDE 0.9 % IV SOLN
INTRAVENOUS | Status: DC | PRN
  Administered 2019-05-24 (×2): 500 mL

## 2019-05-24 MED ORDER — CEFAZOLIN SODIUM-DEXTROSE 2-4 GM/100ML-% IV SOLN
2.0000 g | INTRAVENOUS | Status: AC
  Administered 2019-05-24: 16:00:00 2 g via INTRAVENOUS
  Filled 2019-05-24: qty 100

## 2019-05-24 MED ORDER — ONDANSETRON HCL 4 MG/2ML IJ SOLN
INTRAMUSCULAR | Status: DC | PRN
  Administered 2019-05-24: 4 mg via INTRAVENOUS

## 2019-05-24 MED ORDER — ADULT MULTIVITAMIN W/MINERALS CH
1.0000 | ORAL_TABLET | Freq: Every day | ORAL | Status: DC
  Administered 2019-05-25: 1 via ORAL
  Filled 2019-05-24 (×2): qty 1

## 2019-05-24 MED ORDER — FENTANYL CITRATE (PF) 250 MCG/5ML IJ SOLN
INTRAMUSCULAR | Status: AC
  Filled 2019-05-24: qty 5

## 2019-05-24 MED ORDER — FENTANYL CITRATE (PF) 100 MCG/2ML IJ SOLN: 25.0000 ug | INTRAMUSCULAR | Status: DC | PRN

## 2019-05-24 MED ORDER — ONDANSETRON HCL 4 MG/2ML IJ SOLN
INTRAMUSCULAR | Status: AC
  Filled 2019-05-24: qty 2

## 2019-05-24 MED ORDER — CHLORHEXIDINE GLUCONATE 4 % EX LIQD: 4.0000 "application " | Freq: Once | CUTANEOUS | Status: DC

## 2019-05-24 MED ORDER — LIDOCAINE 2% (20 MG/ML) 5 ML SYRINGE
INTRAMUSCULAR | Status: AC
  Filled 2019-05-24: qty 5

## 2019-05-24 MED ORDER — METOPROLOL TARTRATE 25 MG PO TABS
25.0000 mg | ORAL_TABLET | Freq: Two times a day (BID) | ORAL | Status: DC
  Administered 2019-05-24 – 2019-05-25 (×2): 25 mg via ORAL
  Filled 2019-05-24 (×3): qty 1

## 2019-05-24 MED ORDER — SODIUM CHLORIDE 0.9 % IV SOLN: INTRAVENOUS | Status: DC

## 2019-05-24 MED ORDER — MIDAZOLAM HCL 2 MG/2ML IJ SOLN
INTRAMUSCULAR | Status: AC
  Filled 2019-05-24: qty 2

## 2019-05-24 MED ORDER — PROPOFOL 10 MG/ML IV BOLUS
INTRAVENOUS | Status: AC
  Filled 2019-05-24: qty 20

## 2019-05-24 MED ORDER — SODIUM CHLORIDE 0.9 % IV SOLN
INTRAVENOUS | Status: AC
  Filled 2019-05-24: qty 1.2

## 2019-05-24 MED ORDER — FENTANYL CITRATE (PF) 100 MCG/2ML IJ SOLN
25.0000 ug | INTRAMUSCULAR | Status: DC | PRN
  Administered 2019-05-24 – 2019-05-25 (×4): 50 ug via INTRAVENOUS
  Filled 2019-05-24 (×4): qty 2

## 2019-05-24 MED ORDER — EPHEDRINE SULFATE-NACL 50-0.9 MG/10ML-% IV SOSY
PREFILLED_SYRINGE | INTRAVENOUS | Status: DC | PRN
  Administered 2019-05-24: 10 mg via INTRAVENOUS

## 2019-05-24 MED ORDER — CEFAZOLIN SODIUM-DEXTROSE 1-4 GM/50ML-% IV SOLN
1.0000 g | Freq: Four times a day (QID) | INTRAVENOUS | Status: AC
  Administered 2019-05-24 – 2019-05-25 (×3): 1 g via INTRAVENOUS
  Filled 2019-05-24 (×3): qty 50

## 2019-05-24 MED ORDER — ACETAMINOPHEN 325 MG PO TABS: 325.0000 mg | ORAL_TABLET | ORAL | Status: DC | PRN

## 2019-05-24 MED ORDER — SODIUM CHLORIDE 0.9% IV SOLUTION: Freq: Once | INTRAVENOUS | Status: DC

## 2019-05-24 MED ORDER — LIDOCAINE 2% (20 MG/ML) 5 ML SYRINGE
INTRAMUSCULAR | Status: DC | PRN
  Administered 2019-05-24: 100 mg via INTRAVENOUS

## 2019-05-24 MED ORDER — SODIUM CHLORIDE 0.9 % IV SOLN
INTRAVENOUS | Status: DC | PRN
  Administered 2019-05-24: 500 mL

## 2019-05-24 MED ORDER — SODIUM CHLORIDE 0.9 % IV SOLN: 80.0000 mg | INTRAVENOUS | Status: DC

## 2019-05-24 MED ORDER — PROPOFOL 10 MG/ML IV BOLUS
INTRAVENOUS | Status: DC | PRN
  Administered 2019-05-24: 150 mg via INTRAVENOUS

## 2019-05-24 SURGICAL SUPPLY — 48 items
BAG BANDED W/RUBBER/TAPE 36X54 (MISCELLANEOUS) ×3 IMPLANT
BLADE OSCILLATING /SAGITTAL (BLADE) IMPLANT
BLADE STERNUM SYSTEM 6 (BLADE) ×3 IMPLANT
BNDG COHESIVE 4X5 WHT NS (GAUZE/BANDAGES/DRESSINGS) IMPLANT
CANISTER SUCT 3000ML PPV (MISCELLANEOUS) ×3 IMPLANT
COIL ONE TIE COMPRESSION (VASCULAR PRODUCTS) ×2 IMPLANT
COVER BACK TABLE 60X90IN (DRAPES) ×3 IMPLANT
COVER SURGICAL LIGHT HANDLE (MISCELLANEOUS) ×2 IMPLANT
COVER WAND RF STERILE (DRAPES) ×3 IMPLANT
DRAPE CARDIOVASCULAR INCISE (DRAPES) ×2
DRAPE SRG 135X102X78XABS (DRAPES) ×1 IMPLANT
DRAPE TABLE BACK 80X90 (DRAPES) ×2 IMPLANT
DRSG OPSITE 6X11 MED (GAUZE/BANDAGES/DRESSINGS) IMPLANT
ELECT REM PT RETURN 9FT ADLT (ELECTROSURGICAL) ×6
ELECTRODE REM PT RTRN 9FT ADLT (ELECTROSURGICAL) ×2 IMPLANT
GAUZE 4X4 16PLY RFD (DISPOSABLE) ×4 IMPLANT
GAUZE SPONGE 4X4 12PLY STRL (GAUZE/BANDAGES/DRESSINGS) ×2 IMPLANT
GLOVE BIOGEL PI IND STRL 7.5 (GLOVE) ×1 IMPLANT
GLOVE BIOGEL PI INDICATOR 7.5 (GLOVE) ×2
GLOVE ECLIPSE 8.0 STRL XLNG CF (GLOVE) ×3 IMPLANT
GOWN STRL REUS W/ TWL LRG LVL3 (GOWN DISPOSABLE) IMPLANT
GOWN STRL REUS W/ TWL XL LVL3 (GOWN DISPOSABLE) ×1 IMPLANT
GOWN STRL REUS W/TWL LRG LVL3 (GOWN DISPOSABLE)
GOWN STRL REUS W/TWL XL LVL3 (GOWN DISPOSABLE) ×2
KIT TURNOVER KIT B (KITS) ×3 IMPLANT
LEAD DURATA 7122-65CM (Lead) ×2 IMPLANT
NDL PERC 18GX7CM (NEEDLE) IMPLANT
NEEDLE PERC 18GX7CM (NEEDLE) ×3 IMPLANT
PAD ARMBOARD 7.5X6 YLW CONV (MISCELLANEOUS) ×6 IMPLANT
PAD ELECT DEFIB RADIOL ZOLL (MISCELLANEOUS) ×3 IMPLANT
SHEATH CLASSIC 7F (SHEATH) ×2 IMPLANT
SHEATH EVOLUTION RL 11F (SHEATH) ×2 IMPLANT
SHEATH EVOLUTION SHORTE RL 11F (SHEATH) ×2 IMPLANT
SHEATH PINNACLE 6F 10CM (SHEATH) ×2 IMPLANT
SHEATH TIGHTRAIL MECH (SHEATH) ×1
SHEATH TIGHTRAIL MECH 11F (SHEATH) ×1 IMPLANT
SLING ARM FOAM STRAP XLG (SOFTGOODS) ×2 IMPLANT
STYLET LIBERATOR LOCKING (MISCELLANEOUS) ×4 IMPLANT
SUT PROLENE 2 0 SH DA (SUTURE) IMPLANT
TAPE CLOTH SURG 4X10 WHT LF (GAUZE/BANDAGES/DRESSINGS) ×2 IMPLANT
TOWEL GREEN STERILE (TOWEL DISPOSABLE) ×6 IMPLANT
TOWEL GREEN STERILE FF (TOWEL DISPOSABLE) ×6 IMPLANT
TRAY FOLEY SLVR 16FR TEMP STAT (SET/KITS/TRAYS/PACK) ×3 IMPLANT
TUBE CONNECTING 12'X1/4 (SUCTIONS)
TUBE CONNECTING 12X1/4 (SUCTIONS) IMPLANT
TUBING EXTENTION W/L.L. (IV SETS) ×2 IMPLANT
WIRE HI TORQ VERSACORE J 260CM (WIRE) ×2 IMPLANT
YANKAUER SUCT BULB TIP NO VENT (SUCTIONS) IMPLANT

## 2019-05-24 NOTE — Plan of Care (Signed)
Initiated Care Plan  Problem: Clinical Measurements: Goal: Postoperative complications will be avoided or minimized Outcome: Progressing   Problem: Skin Integrity: Goal: Demonstration of wound healing without infection will improve Outcome: Progressing   Problem: Education: Goal: Knowledge of General Education information will improve Description: Including pain rating scale, medication(s)/side effects and non-pharmacologic comfort measures Outcome: Progressing   Problem: Health Behavior/Discharge Planning: Goal: Ability to manage health-related needs will improve Outcome: Progressing   Problem: Clinical Measurements: Goal: Ability to maintain clinical measurements within normal limits will improve Outcome: Progressing Goal: Will remain free from infection Outcome: Progressing Goal: Diagnostic test results will improve Outcome: Progressing Goal: Respiratory complications will improve Outcome: Progressing Goal: Cardiovascular complication will be avoided Outcome: Progressing   Problem: Activity: Goal: Risk for activity intolerance will decrease Outcome: Progressing   Problem: Nutrition: Goal: Adequate nutrition will be maintained Outcome: Progressing   Problem: Coping: Goal: Level of anxiety will decrease Outcome: Progressing   Problem: Elimination: Goal: Will not experience complications related to bowel motility Outcome: Progressing Goal: Will not experience complications related to urinary retention Outcome: Progressing   Problem: Pain Managment: Goal: General experience of comfort will improve Outcome: Progressing   Problem: Safety: Goal: Ability to remain free from injury will improve Outcome: Progressing   Problem: Skin Integrity: Goal: Risk for impaired skin integrity will decrease Outcome: Progressing

## 2019-05-24 NOTE — Transfer of Care (Signed)
Immediate Anesthesia Transfer of Care Note  Patient: Brian Arellano  Procedure(s) Performed: PACEMAKER LEAD REMOVAL, REIMPLANT (Right Chest)  Patient Location: PACU  Anesthesia Type:General  Level of Consciousness: drowsy and patient cooperative  Airway & Oxygen Therapy: Patient Spontanous Breathing  Post-op Assessment: Report given to RN and Post -op Vital signs reviewed and stable  Post vital signs: Reviewed and stable  Last Vitals:  Vitals Value Taken Time  BP    Temp    Pulse    Resp    SpO2      Last Pain:  Vitals:   05/24/19 1323  PainSc: 0-No pain      Patients Stated Pain Goal: 0 (05/24/19 1323)  Complications: No apparent anesthesia complications

## 2019-05-24 NOTE — Anesthesia Preprocedure Evaluation (Signed)
Anesthesia Evaluation  Patient identified by MRN, date of birth, ID band Patient awake    Reviewed: Allergy & Precautions, NPO status , Patient's Chart, lab work & pertinent test results  Airway Mallampati: II  TM Distance: >3 FB Neck ROM: Full    Dental no notable dental hx.    Pulmonary neg pulmonary ROS,    Pulmonary exam normal breath sounds clear to auscultation       Cardiovascular hypertension, Normal cardiovascular exam+ pacemaker + Cardiac Defibrillator  Rhythm:Regular Rate:Normal     Neuro/Psych negative neurological ROS  negative psych ROS   GI/Hepatic negative GI ROS, Neg liver ROS,   Endo/Other  negative endocrine ROS  Renal/GU negative Renal ROS  negative genitourinary   Musculoskeletal negative musculoskeletal ROS (+)   Abdominal   Peds negative pediatric ROS (+)  Hematology negative hematology ROS (+)   Anesthesia Other Findings   Reproductive/Obstetrics negative OB ROS                             Anesthesia Physical Anesthesia Plan  ASA: III  Anesthesia Plan: General   Post-op Pain Management:    Induction: Intravenous  PONV Risk Score and Plan: 2 and Ondansetron, Dexamethasone and Treatment may vary due to age or medical condition  Airway Management Planned: Oral ETT  Additional Equipment:   Intra-op Plan:   Post-operative Plan: Extubation in OR  Informed Consent: I have reviewed the patients History and Physical, chart, labs and discussed the procedure including the risks, benefits and alternatives for the proposed anesthesia with the patient or authorized representative who has indicated his/her understanding and acceptance.     Dental advisory given  Plan Discussed with: CRNA and Surgeon  Anesthesia Plan Comments:         Anesthesia Quick Evaluation

## 2019-05-24 NOTE — Interval H&P Note (Signed)
History and Physical Interval Note:  05/24/2019 2:02 PM  Brian Arellano  has presented today for surgery, with the diagnosis of lead failure.  The various methods of treatment have been discussed with the patient and family. After consideration of risks, benefits and other options for treatment, the patient has consented to  ICD lead removal and insertion of a new ICD lead as a surgical intervention.  The patient's history has been reviewed, patient examined, no change in status, stable for surgery.  I have reviewed the patient's chart and labs.  Questions were answered to the patient's satisfaction.     Lewayne Bunting

## 2019-05-24 NOTE — Progress Notes (Signed)
Kerry Fort ([address]) notified of patient's arrival to short stay. Stated he would be here shortly.

## 2019-05-24 NOTE — CV Procedure (Signed)
EP Procedure Note  Preoperative diagnosis: ICD lead failure  Postoperative diagnosis: ICD lead failure  Procedure Performed: ICD generator removal, ICD lead extraction, ICD lead insertion, ICD generator re-insertion, with defibrillation threshold testing  Description of the procedure: after informed consent was obtained, the patient was taken to the operating room in the fasting state.  The anesthesia service was utilized to provide general endotracheal anesthesia as well as invasive arterial hemodynamic monitoring.  After the usual preparation and draping and appropriate timeouts were carried out, a 6 French sheath was inserted percutaneously in the right femoral vein for central venous access.   At this point, the left subclavian vein was punctured and a guidewire inserted into the right atrium.  The guidewire was fixed in place with a clamp.    Next attention was turned to the ICD lead removal.  A 6 cm ellipse incision was carried out over the old ICD insertion site.  Electrocautery was utilized to dissect down to the fascia.  The defibrillator sewing sleeve was revealed and electrocautery utilized to free up the dense fibrous scar tissue around the sewing sleeve.  A combination of electrocautery and blunt dissection was then used to dissect down through the pectoralis major muscle and into the subpectoral pocket.  The generator was removed with gentle traction.  The lead was disconnected from the generator and the defibrillator lead was freed up from its dense fibrous scar tissue with electrocautery.  At this point a stylette was inserted into the body of the lead and the helix retracted.  Gentle traction was placed on the lead and it was fixed in place.  The lead was cut.  The [facility] liberator locking stylette was then advanced to the tip of the lead and locked in place.  A Cook 1 tie suture was placed on the proximal portion of the lead.  A Cook 41 French RL short dissection sheath was then  utilized and a combination of traction countertraction, pressure and counterpressure was applied to the defibrillation lead with the short dissection sheath and the very dense fibrous scar tissue was freed up from the vein lead entry site down to the junction of the subclavian and innominate vein.  The [facility] short sheath was removed and the [facility] 11 [place] RL long dissection sheath was advanced over the [place] lead down to the innominate superior vena cava junction.  At this point the lead could not be at removed and the sheath could not be advanced further.  The long Cook dissection sheath was removed and the [employer] 11 French dissection sheath was then inserted over the lead and was able to get past the very dense fibrous scar tissue at the innominate vein superior vena cava junction.  Additional traction and countertraction as well as pressure and counterpressure was applied to the sheath and the lead and the extraction sheath was advanced down to the tip of the defibrillation lead where it was attached to the right ventricular septal myocardium.  Gentle traction was applied to the extraction sheath and lead and the lead was then removed in total.  There was transient hypotension secondary to invagination of the right ventricular lead but this resolved immediately with extraction of the lead.  Next a 7 French sheath was advanced over the previously placed guidewire and the [place] single coil defibrillation lead, serial number [id number] was advanced under fluoroscopic guidance into the right ventricle on down into the area of the RV apical septum.  R waves measured  14 mV.  The pacing impedance with a lead actively fixed was 628 ohms in the pacing threshold was less than a volt 2.5 ms.  The lead was actively fixed and the lead was secured to the fascia with silk suture.  The sewing sleeve was secured with silk suture.  At this point the [facility] single-chamber ICD, serial [id number]  was connected to the new ICD lead and placed back in the subpectoral pocket.  The pocket was irrigated with antibiotic irrigation.  The pectoralis major was reapproximated and closed with 2-0 Vicryl suture as well as the deep tissue.  The subcutaneous tissue was closed with 3-0 Vicryl suture.  Benzoin and Steri-Strips were pain on the skin.  The previously placed 6 French sheath in the right femoral vein was removed and pressure was held and hemostasis assured.  The patient was then returned to the recovery area for recovery.   Complications: There were no immediate procedure complications  Conclusion: Successful removal of a 45 year old active-fixation single coil defibrillation lead which had broken and insertion of a new single coil active-fixation defibrillation lead in a patient with prior ventricular fibrillation cardiac arrest.   Lewayne Bunting, MD

## 2019-05-24 NOTE — Anesthesia Procedure Notes (Signed)
Procedure Name: Intubation Date/Time: 05/24/2019 3:22 PM Performed by: Janace Litten, CRNA Pre-anesthesia Checklist: Patient identified, Emergency Drugs available, Suction available and Patient being monitored Patient Re-evaluated:Patient Re-evaluated prior to induction Oxygen Delivery Method: Circle System Utilized Preoxygenation: Pre-oxygenation with 100% oxygen Induction Type: IV induction Ventilation: Mask ventilation without difficulty Laryngoscope Size: Mac and 4 Grade View: Grade I Tube type: Oral Tube size: 7.5 mm Number of attempts: 1 Airway Equipment and Method: Stylet Placement Confirmation: ETT inserted through vocal cords under direct vision,  positive ETCO2 and breath sounds checked- equal and bilateral Secured at: 23 cm Tube secured with: Tape Dental Injury: Teeth and Oropharynx as per pre-operative assessment

## 2019-05-24 NOTE — Anesthesia Postprocedure Evaluation (Signed)
Anesthesia Post Note  Patient: Deontre Allsup Dresner  Procedure(s) Performed: PACEMAKER LEAD REMOVAL, REIMPLANT (Right Chest)     Patient location during evaluation: PACU Anesthesia Type: General Level of consciousness: awake and alert Pain management: pain level controlled Vital Signs Assessment: post-procedure vital signs reviewed and stable Respiratory status: spontaneous breathing, nonlabored ventilation, respiratory function stable and patient connected to nasal cannula oxygen Cardiovascular status: blood pressure returned to baseline and stable Postop Assessment: no apparent nausea or vomiting Anesthetic complications: no    Last Vitals:  Vitals:   05/24/19 1315 05/24/19 1752  BP: 132/83 (!) 118/43  Pulse: (!) 51 67  Resp: 20 14  Temp: 36.6 C 36.6 C  SpO2: 99% 96%    Last Pain:  Vitals:   05/24/19 1752  PainSc: 0-No pain                 Kelby Adell S

## 2019-05-25 ENCOUNTER — Inpatient Hospital Stay (HOSPITAL_COMMUNITY): Payer: BC Managed Care – PPO

## 2019-05-25 DIAGNOSIS — I4901 Ventricular fibrillation: Secondary | ICD-10-CM

## 2019-05-25 LAB — MRSA PCR SCREENING: MRSA by PCR: NEGATIVE

## 2019-05-25 MED ORDER — CHLORHEXIDINE GLUCONATE CLOTH 2 % EX PADS: 6.0000 | MEDICATED_PAD | Freq: Every day | CUTANEOUS | Status: DC

## 2019-05-25 MED ORDER — KETOROLAC TROMETHAMINE 60 MG/2ML IM SOLN
60.0000 mg | Freq: Once | INTRAMUSCULAR | Status: AC
  Administered 2019-05-25: 10:00:00 60 mg via INTRAMUSCULAR
  Filled 2019-05-25: qty 2

## 2019-05-25 MED FILL — Gentamicin Sulfate Inj 40 MG/ML: INTRAMUSCULAR | Qty: 80 | Status: AC

## 2019-05-25 NOTE — Plan of Care (Signed)
  Problem: Clinical Measurements: Goal: Postoperative complications will be avoided or minimized 05/25/2019 0749 by Luna Kitchens, RN Outcome: Progressing 05/24/2019 1942 by Luna Kitchens, RN Outcome: Progressing   Problem: Skin Integrity: Goal: Demonstration of wound healing without infection will improve 05/25/2019 0749 by Luna Kitchens, RN Outcome: Progressing 05/24/2019 1942 by Luna Kitchens, RN Outcome: Progressing   Problem: Education: Goal: Knowledge of General Education information will improve Description: Including pain rating scale, medication(s)/side effects and non-pharmacologic comfort measures 05/25/2019 0749 by Luna Kitchens, RN Outcome: Progressing 05/24/2019 1942 by Luna Kitchens, RN Outcome: Progressing   Problem: Health Behavior/Discharge Planning: Goal: Ability to manage health-related needs will improve 05/25/2019 0749 by Luna Kitchens, RN Outcome: Progressing 05/24/2019 1942 by Luna Kitchens, RN Outcome: Progressing   Problem: Clinical Measurements: Goal: Ability to maintain clinical measurements within normal limits will improve 05/25/2019 0749 by Luna Kitchens, RN Outcome: Progressing 05/24/2019 1942 by Luna Kitchens, RN Outcome: Progressing Goal: Will remain free from infection 05/25/2019 0749 by Luna Kitchens, RN Outcome: Progressing 05/24/2019 1942 by Luna Kitchens, RN Outcome: Progressing Goal: Diagnostic test results will improve 05/25/2019 0749 by Luna Kitchens, RN Outcome: Progressing 05/24/2019 1942 by Luna Kitchens, RN Outcome: Progressing Goal: Respiratory complications will improve 05/25/2019 0749 by Luna Kitchens, RN Outcome: Progressing 05/24/2019 1942 by Luna Kitchens, RN Outcome: Progressing Goal: Cardiovascular complication will be avoided 05/25/2019 0749 by Luna Kitchens, RN Outcome: Progressing 05/24/2019 1942 by Luna Kitchens, RN Outcome: Progressing   Problem: Activity: Goal: Risk for activity intolerance will decrease 05/25/2019 0749 by Luna Kitchens,  RN Outcome: Progressing 05/24/2019 1942 by Luna Kitchens, RN Outcome: Progressing   Problem: Nutrition: Goal: Adequate nutrition will be maintained 05/25/2019 0749 by Luna Kitchens, RN Outcome: Progressing 05/24/2019 1942 by Luna Kitchens, RN Outcome: Progressing   Problem: Coping: Goal: Level of anxiety will decrease 05/25/2019 0749 by Luna Kitchens, RN Outcome: Progressing 05/24/2019 1942 by Luna Kitchens, RN Outcome: Progressing   Problem: Elimination: Goal: Will not experience complications related to bowel motility 05/25/2019 0749 by Luna Kitchens, RN Outcome: Progressing 05/24/2019 1942 by Luna Kitchens, RN Outcome: Progressing Goal: Will not experience complications related to urinary retention 05/25/2019 0749 by Luna Kitchens, RN Outcome: Progressing 05/24/2019 1942 by Luna Kitchens, RN Outcome: Progressing   Problem: Pain Managment: Goal: General experience of comfort will improve 05/25/2019 0749 by Luna Kitchens, RN Outcome: Progressing 05/24/2019 1942 by Luna Kitchens, RN Outcome: Progressing   Problem: Safety: Goal: Ability to remain free from injury will improve 05/25/2019 0749 by Luna Kitchens, RN Outcome: Progressing 05/24/2019 1942 by Luna Kitchens, RN Outcome: Progressing   Problem: Skin Integrity: Goal: Risk for impaired skin integrity will decrease 05/25/2019 0749 by Luna Kitchens, RN Outcome: Progressing 05/24/2019 1942 by Luna Kitchens, RN Outcome: Progressing

## 2019-05-25 NOTE — Discharge Summary (Addendum)
ELECTROPHYSIOLOGY PROCEDURE DISCHARGE SUMMARY    Patient ID: Brian Arellano,  MRN: [id number], DOB/AGE: 20-Jun-1975 44 y.o.  Admit date: 05/24/2019 Discharge date: 05/25/2019  Primary Care Physician: Patient, No Pcp Per Electrophysiologist: Ladona Ridgel  Primary Discharge Diagnosis:  Active Problems:   ICD (implantable cardioverter-defibrillator) lead failure  Secondary Discharge Diagnosis: 1.  Prior cardiac arrest   No Known Allergies  Procedures This Admission: 1.  Extraction of previously implanted RV lead and insertion of new RV lead with new STJ single chamber ICD on 05/23/18 by Dr Ladona Ridgel. See op note for full details. 2.  CXR on 05/24/18 with no obvious pneumothorax  Brief HPI:  Brian Arellano is a 44 y.o. male with the above past medical history. He is s/p ICD placement for secondary prevention in 2009.  His RV lead has recently demonstrated noise. Risks, benefits to lead extraction and re-implantation were reviewed with the patient who wished to proceed.   Hospital Course:  The patient was admitted and underwent lead extraction/re-implantation with details as outlined above. He was monitored on telemetry overnight which demonstrated SR.  Left chest was without hematoma/eccymosis. He was examined by Dr Ladona Ridgel and considered stable for discharge to home. He did have some oozing at the lateral aspect of his incision. Pressure was held for 15 minutes with steri-strips reapplied.   Physical Exam: Vitals:   05/25/19 0318 05/25/19 0620 05/25/19 0730 05/25/19 0752  BP: (!) 101/59  (!) 117/55 121/64  Pulse: 65 71 67 90  Resp: 15 19 17 18   Temp: 98 F (36.7 C)   98 F (36.7 C)  TempSrc: Oral   Oral  SpO2: 96%   96%  Weight:      Height:        GEN- The patient is well appearing, alert and oriented x 3 today.   HEENT: normocephalic, atraumatic; sclera clear, conjunctiva pink; hearing intact; oropharynx clear; neck supple  Lungs- Clear to ausculation bilaterally,  normal work of breathing.  No wheezes, rales, rhonchi Heart- Regular rate and rhythm  GI- soft, non-tender, non-distended, bowel sounds present  Extremities- no clubbing, cyanosis, or edema  MS- no significant deformity or atrophy Skin- warm and dry, no rash or lesion, left chest without hematoma  Psych- euthymic mood, full affect Neuro- strength and sensation are intact    Labs:   Lab Results  Component Value Date   WBC 6.1 05/22/2019   HGB 16.1 05/22/2019   HCT 48.2 05/22/2019   MCV 90.8 05/22/2019   PLT 150 05/22/2019    Recent Labs  Lab 05/24/19 1330  NA 137  K 4.0  CL 103  CO2 26  BUN 14  CREATININE 1.03  CALCIUM 9.1  GLUCOSE 96     Discharge Medications:  Allergies as of 05/25/2019   No Known Allergies      Medication List     TAKE these medications    aspirin 81 MG tablet Take 81 mg by mouth daily.   metoprolol tartrate 25 MG tablet Commonly known as: LOPRESSOR Take 1 tablet by mouth twice daily   multivitamin with minerals tablet Take 1 tablet by mouth daily.   Potassium 95 MG Tabs Take 95 mg by mouth daily.        Disposition: Pt is being discharged home today in good condition.   Follow-up Information     [facility] Office Follow up on 06/06/2019.   Specialty: Cardiology Why: 12 Noon Contact information: [address],  [address] [place]        Evans Lance, MD Follow up on 08/25/2019.   Specialty: Cardiology Why: at 2:15PM   Contact information: [address] [address] [phone]            Duration of Discharge Encounter: Greater than 30 minutes including physician time.  Signed, Chanetta Marshall, NP 05/25/2019 8:40 AM  EP Attending  Patient seen and examined. Agree with the findings as noted above. The patient is doing well after ICD lead extraction and insertion of a new ICD lead. His device is working normally and the CXR  demonstrates no PTX.   Mikle Bosworth.D.

## 2019-05-25 NOTE — Plan of Care (Signed)
  Problem: Skin Integrity: Goal: Demonstration of wound healing without infection will improve 05/25/2019 1448 by Luna Kitchens, RN Outcome: Adequate for Discharge 05/25/2019 0749 by Luna Kitchens, RN Outcome: Progressing   Problem: Health Behavior/Discharge Planning: Goal: Ability to manage health-related needs will improve 05/25/2019 1448 by Luna Kitchens, RN Outcome: Adequate for Discharge 05/25/2019 0749 by Luna Kitchens, RN Outcome: Progressing   Problem: Clinical Measurements: Goal: Ability to maintain clinical measurements within normal limits will improve 05/25/2019 1448 by Luna Kitchens, RN Outcome: Adequate for Discharge 05/25/2019 0749 by Luna Kitchens, RN Outcome: Progressing Goal: Will remain free from infection 05/25/2019 1448 by Luna Kitchens, RN Outcome: Adequate for Discharge 05/25/2019 0749 by Luna Kitchens, RN Outcome: Progressing Goal: Diagnostic test results will improve 05/25/2019 1448 by Luna Kitchens, RN Outcome: Adequate for Discharge 05/25/2019 0749 by Luna Kitchens, RN Outcome: Progressing Goal: Respiratory complications will improve 05/25/2019 1448 by Luna Kitchens, RN Outcome: Adequate for Discharge 05/25/2019 0749 by Luna Kitchens, RN Outcome: Progressing Goal: Cardiovascular complication will be avoided 05/25/2019 1448 by Luna Kitchens, RN Outcome: Adequate for Discharge 05/25/2019 0749 by Luna Kitchens, RN Outcome: Progressing   Problem: Activity: Goal: Risk for activity intolerance will decrease 05/25/2019 1448 by Luna Kitchens, RN Outcome: Adequate for Discharge 05/25/2019 0749 by Luna Kitchens, RN Outcome: Progressing   Problem: Nutrition: Goal: Adequate nutrition will be maintained 05/25/2019 1448 by Luna Kitchens, RN Outcome: Adequate for Discharge 05/25/2019 0749 by Luna Kitchens, RN Outcome: Progressing   Problem: Coping: Goal: Level of anxiety will decrease 05/25/2019 1448 by Luna Kitchens, RN Outcome: Adequate for Discharge 05/25/2019 0749 by Luna Kitchens,  RN Outcome: Progressing   Problem: Elimination: Goal: Will not experience complications related to bowel motility 05/25/2019 1448 by Luna Kitchens, RN Outcome: Adequate for Discharge 05/25/2019 0749 by Luna Kitchens, RN Outcome: Progressing Goal: Will not experience complications related to urinary retention 05/25/2019 1448 by Luna Kitchens, RN Outcome: Adequate for Discharge 05/25/2019 0749 by Luna Kitchens, RN Outcome: Progressing   Problem: Pain Managment: Goal: General experience of comfort will improve 05/25/2019 1448 by Luna Kitchens, RN Outcome: Adequate for Discharge 05/25/2019 0749 by Luna Kitchens, RN Outcome: Progressing   Problem: Safety: Goal: Ability to remain free from injury will improve 05/25/2019 1448 by Luna Kitchens, RN Outcome: Adequate for Discharge 05/25/2019 0749 by Luna Kitchens, RN Outcome: Progressing   Problem: Skin Integrity: Goal: Risk for impaired skin integrity will decrease 05/25/2019 1448 by Luna Kitchens, RN Outcome: Adequate for Discharge 05/25/2019 0749 by Luna Kitchens, RN Outcome: Progressing

## 2019-05-25 NOTE — Discharge Instructions (Signed)
    Supplemental Discharge Instructions for  Pacemaker/Defibrillator Patients  Activity No heavy lifting or vigorous activity with your left/right arm for 6 to 8 weeks.  Do not raise your left/right arm above your head for one week.  Gradually raise your affected arm as drawn below.           __          05/29/19                     05/30/19                  05/31/19                  06/01/19  NO DRIVING for  1 week   ; you may begin driving on 7/62/26    .  WOUND CARE - Keep the wound area clean and dry.  Do not get this area wet for one week. No showers for one week; you may shower on    06/01/19 . - The tape/steri-strips on your wound will fall off; do not pull them off.  No bandage is needed on the site.  DO  NOT apply any creams, oils, or ointments to the wound area. - If you notice any drainage or discharge from the wound, any swelling or bruising at the site, or you develop a fever > 101? F after you are discharged home, call the office at once.  Special Instructions - You are still able to use cellular telephones; use the ear opposite the side where you have your pacemaker/defibrillator.  Avoid carrying your cellular phone near your device. - When traveling through airports, show security personnel your identification card to avoid being screened in the metal detectors.  Ask the security personnel to use the hand wand. - Avoid arc welding equipment, MRI testing (magnetic resonance imaging), TENS units (transcutaneous nerve stimulators).  Call the office for questions about other devices. - Avoid electrical appliances that are in poor condition or are not properly grounded. - Microwave ovens are safe to be near or to operate.  Additional information for defibrillator patients should your device go off: - If your device goes off ONCE and you feel fine afterward, notify the device clinic nurses. - If your device goes off ONCE and you do not feel well afterward, call 911. - If your device  goes off TWICE, call 911. - If your device goes off THREE times in one day, call 911.  DO NOT DRIVE YOURSELF OR A FAMILY MEMBER WITH A DEFIBRILLATOR TO THE HOSPITAL--CALL 911.

## 2019-05-26 ENCOUNTER — Encounter: Payer: Self-pay | Admitting: Surgery

## 2019-05-26 ENCOUNTER — Encounter: Payer: Self-pay | Admitting: *Deleted

## 2019-05-26 LAB — TYPE AND SCREEN
ABO/RH(D): B POS
Antibody Screen: NEGATIVE
Unit division: 0
Unit division: 0

## 2019-05-26 LAB — BPAM RBC
Blood Product Expiration Date: 202102102359
Blood Product Expiration Date: 202102122359
Unit Type and Rh: 7300
Unit Type and Rh: 7300

## 2019-05-26 NOTE — Progress Notes (Signed)
Cardiothoracic Surgery  I provided surgical backup in the operating room for this patient's ICD lead extraction for 76 minutes on 05/24/2019.

## 2019-06-06 ENCOUNTER — Ambulatory Visit (INDEPENDENT_AMBULATORY_CARE_PROVIDER_SITE_OTHER): Payer: BC Managed Care – PPO | Admitting: *Deleted

## 2019-06-06 ENCOUNTER — Other Ambulatory Visit: Payer: Self-pay

## 2019-06-06 DIAGNOSIS — I469 Cardiac arrest, cause unspecified: Secondary | ICD-10-CM

## 2019-06-06 NOTE — Patient Instructions (Signed)
Call office if you have any redness, swelling or drainage from wound site.

## 2019-06-22 LAB — CUP PACEART INCLINIC DEVICE CHECK
Battery Remaining Longevity: 80 mo
Brady Statistic RV Percent Paced: 0 %
Date Time Interrogation Session: 20210119131500
HighPow Impedance: 65.25 Ohm
Implantable Lead Implant Date: 20210105190000
Implantable Lead Location: 753860
Implantable Lead Model: 7122
Implantable Pulse Generator Implant Date: 20180328200000
Lead Channel Impedance Value: 425 Ohm
Lead Channel Pacing Threshold Amplitude: 0.75 V
Lead Channel Pacing Threshold Pulse Width: 0.4 ms
Lead Channel Sensing Intrinsic Amplitude: 12 mV
Lead Channel Setting Pacing Amplitude: 3.5 V
Lead Channel Setting Pacing Pulse Width: 0.4 ms
Lead Channel Setting Sensing Sensitivity: 0.5 mV
Pulse Gen Serial Number: 7410740

## 2019-06-22 NOTE — Progress Notes (Signed)
Subcutaneous ICD  and wound check in clinic. Steri-strips removed, wound edges approximated. No redness, drainage. 0  untreated episodes; 0 treated episodes; 0 shocks delivered. Electrode impedance status okay. No programming changes. Remaining longevity to ERI  74 %. Next follow up with GT 08/25/19. Next remote transmission 08/24/19 and every 3 months.

## 2019-08-24 ENCOUNTER — Ambulatory Visit (INDEPENDENT_AMBULATORY_CARE_PROVIDER_SITE_OTHER): Payer: BC Managed Care – PPO | Admitting: *Deleted

## 2019-08-24 DIAGNOSIS — I469 Cardiac arrest, cause unspecified: Secondary | ICD-10-CM | POA: Diagnosis not present

## 2019-08-24 LAB — CUP PACEART REMOTE DEVICE CHECK
Battery Remaining Longevity: 71 mo
Battery Remaining Percentage: 73 %
Battery Voltage: 2.98 V
Brady Statistic RV Percent Paced: 1 %
Date Time Interrogation Session: 20210408020019
HighPow Impedance: 63 Ohm
HighPow Impedance: 63 Ohm
Implantable Lead Implant Date: 20210106
Implantable Lead Location: 753860
Implantable Lead Model: 7122
Implantable Pulse Generator Implant Date: 20180329
Lead Channel Impedance Value: 410 Ohm
Lead Channel Pacing Threshold Amplitude: 0.75 V
Lead Channel Pacing Threshold Pulse Width: 0.4 ms
Lead Channel Sensing Intrinsic Amplitude: 12 mV
Lead Channel Setting Pacing Amplitude: 3.5 V
Lead Channel Setting Pacing Pulse Width: 0.4 ms
Lead Channel Setting Sensing Sensitivity: 0.5 mV
Pulse Gen Serial Number: 7410740

## 2019-08-24 NOTE — Progress Notes (Signed)
ICD Remote  

## 2019-08-25 ENCOUNTER — Encounter: Payer: BC Managed Care – PPO | Admitting: Internal Medicine

## 2019-09-13 ENCOUNTER — Encounter: Payer: Self-pay | Admitting: Internal Medicine

## 2019-09-13 ENCOUNTER — Other Ambulatory Visit: Payer: Self-pay

## 2019-09-13 ENCOUNTER — Ambulatory Visit: Payer: BC Managed Care – PPO | Admitting: Internal Medicine

## 2019-09-13 VITALS — BP 106/70 | HR 70 | Ht 70.0 in | Wt 172.0 lb

## 2019-09-13 DIAGNOSIS — I10 Essential (primary) hypertension: Secondary | ICD-10-CM | POA: Diagnosis not present

## 2019-09-13 DIAGNOSIS — I4901 Ventricular fibrillation: Secondary | ICD-10-CM

## 2019-09-13 DIAGNOSIS — Z95810 Presence of automatic (implantable) cardiac defibrillator: Secondary | ICD-10-CM | POA: Diagnosis not present

## 2019-09-13 NOTE — Patient Instructions (Signed)
Medication Instructions:  Your physician recommends that you continue on your current medications as directed. Please refer to the Current Medication list given to you today.  Labwork: None ordered.  Testing/Procedures: None ordered.  Follow-Up: Your physician wants you to follow-up in: one year with Dr. Ladona Ridgel.   You will receive a reminder letter in the mail two months in advance. If you don't receive a letter, please call our office to schedule the follow-up appointment.  Remote monitoring is used to monitor your ICD from home. This monitoring reduces the number of office visits required to check your device to one time per year. It allows [place] to keep an eye on the functioning of your device to ensure it is working properly. You are scheduled for a device check from home on 11/23/2019. You may send your transmission at any time that day. If you have a wireless device, the transmission will be sent automatically. After your physician reviews your transmission, you will receive a postcard with your next transmission date.  Any Other Special Instructions Will Be Listed Below (If Applicable).  If you need a refill on your cardiac medications before your next appointment, please call your pharmacy.

## 2019-09-13 NOTE — Progress Notes (Signed)
HPI Mr. Emily returns today for followup. He has a h/o VF arrest s/p ICD insertion and then developed ICD lead failure and has undergone ICD system extraction with relocation of his ICD lead. In the interim, the patient has done well. He denies chest pain or sob. No ICD therapies.  No Known Allergies   Current Outpatient Medications  Medication Sig Dispense Refill  . aspirin 81 MG tablet Take 81 mg by mouth daily.      . metoprolol tartrate (LOPRESSOR) 25 MG tablet Take 1 tablet by mouth twice daily 180 tablet 3  . Multiple Vitamins-Minerals (MULTIVITAMIN WITH MINERALS) tablet Take 1 tablet by mouth daily.    . Potassium 95 MG TABS Take 95 mg by mouth daily.      No current facility-administered medications for this visit.     Past Medical History:  Diagnosis Date  . Allergic rhinitis, seasonal   . Cardiac arrest (HCC)   . Cardiac arrest - ventricular fibrillation   . Presence of permanent cardiac pacemaker     ROS:   All systems reviewed and negative except as noted in the HPI.   Past Surgical History:  Procedure Laterality Date  . CARDIAC DEFIBRILLATOR PLACEMENT     ICD St Jude  . ICD GENERATOR CHANGEOUT N/A 08/13/2016   Procedure: ICD Generator Changeout;  Surgeon: Marinus Maw, MD;  Location: [facility] INVASIVE CV LAB;  Service: Cardiovascular;  Laterality: N/A;  . PACEMAKER LEAD REMOVAL Right 05/24/2019   Procedure: PACEMAKER LEAD REMOVAL, REIMPLANT;  Surgeon: Marinus Maw, MD;  Location: [facility] OR;  Service: Cardiovascular;  Laterality: Right;     Family History  Problem Relation Age of Onset  . Atrial fibrillation Cousin 25  . Coronary artery disease Father        cabg x2  . Heart disease Other        father's brother, bypass surgery/father's brother, bypass surgery  . Coronary artery disease Other        family history     Social History   Socioeconomic History  . Marital status: Married    Spouse name: Not on file  . Number of children: Not on  file  . Years of education: Not on file  . Highest education level: Not on file  Occupational History  . Not on file  Tobacco Use  . Smoking status: Never Smoker  . Smokeless tobacco: Never Used  Substance and Sexual Activity  . Alcohol use: No  . Drug use: No  . Sexual activity: Not on file  Other Topics Concern  . Not on file  Social History Narrative  . Not on file   Social Determinants of Health   Financial Resource Strain:   . Difficulty of Paying Living Expenses:   Food Insecurity:   . Worried About Programme researcher, broadcasting/film/video in the Last Year:   . Barista in the Last Year:   Transportation Needs:   . Freight forwarder (Medical):   Marland Kitchen Lack of Transportation (Non-Medical):   Physical Activity:   . Days of Exercise per Week:   . Minutes of Exercise per Session:   Stress:   . Feeling of Stress :   Social Connections:   . Frequency of Communication with Friends and Family:   . Frequency of Social Gatherings with Friends and Family:   . Attends Religious Services:   . Active Member of Clubs or Organizations:   . Attends Banker  Meetings:   Marland Kitchen Marital Status:   Intimate Partner Violence:   . Fear of Current or Ex-Partner:   . Emotionally Abused:   Marland Kitchen Physically Abused:   . Sexually Abused:      BP 106/70   Pulse 70   Ht 5\' 10"  (1.778 m)   Wt 172 lb (78 kg)   SpO2 98%   BMI 24.68 kg/m   Physical Exam:  Well appearing NAD HEENT: Unremarkable Neck:  No JVD, no thyromegally Lymphatics:  No adenopathy Back:  No CVA tenderness Lungs:  Clear with no wheezes HEART:  Regular rate rhythm, no murmurs, no rubs, no clicks Abd:  soft, positive bowel sounds, no organomegally, no rebound, no guarding Ext:  2 plus pulses, no edema, no cyanosis, no clubbing Skin:  No rashes no nodules Neuro:  CN II through XII intact, motor grossly intact  EKG - NSR  DEVICE  Normal device function.  See PaceArt for details.   Assess/Plan: 1. VF - he has had no  additional ventricular arrhythmias since his ICD procedure. 2. ICD - his St. Jude single chamber device is working normally.  3. HTN - his bp is minimally elevated. He will continue his current meds.  Mikle Bosworth.D.

## 2019-09-18 NOTE — Addendum Note (Signed)
Addended by: Solon Augusta on: 09/18/2019 01:13 PM   Modules accepted: Orders

## 2019-09-20 LAB — CUP PACEART INCLINIC DEVICE CHECK
Battery Remaining Longevity: 6.3
Brady Statistic RV Percent Paced: 1 % — CL
Date Time Interrogation Session: 20210428154500
HighPow Impedance: 62 Ohm
Implantable Lead Implant Date: 20210106
Implantable Lead Location: 753860
Implantable Lead Model: 7122
Implantable Pulse Generator Implant Date: 20180329
Lead Channel Impedance Value: 430 Ohm
Lead Channel Pacing Threshold Amplitude: 1.25 V
Lead Channel Pacing Threshold Pulse Width: 0.4 ms
Lead Channel Sensing Intrinsic Amplitude: 12 mV
Lead Channel Setting Pacing Amplitude: 3.5 V
Lead Channel Setting Pacing Pulse Width: 0.4 ms
Lead Channel Setting Sensing Sensitivity: 0.5 mV
Pulse Gen Serial Number: 7410740

## 2019-11-23 ENCOUNTER — Ambulatory Visit (INDEPENDENT_AMBULATORY_CARE_PROVIDER_SITE_OTHER): Payer: BC Managed Care – PPO | Admitting: *Deleted

## 2019-11-23 DIAGNOSIS — I469 Cardiac arrest, cause unspecified: Secondary | ICD-10-CM | POA: Diagnosis not present

## 2019-11-23 LAB — CUP PACEART REMOTE DEVICE CHECK
Battery Remaining Longevity: 74 mo
Battery Remaining Percentage: 71 %
Battery Voltage: 2.98 V
Brady Statistic RV Percent Paced: 1 %
Date Time Interrogation Session: 20210708020016
HighPow Impedance: 64 Ohm
HighPow Impedance: 64 Ohm
Implantable Lead Implant Date: 20210106
Implantable Lead Location: 753860
Implantable Lead Model: 7122
Implantable Pulse Generator Implant Date: 20180329
Lead Channel Impedance Value: 430 Ohm
Lead Channel Pacing Threshold Amplitude: 1.25 V
Lead Channel Pacing Threshold Pulse Width: 0.4 ms
Lead Channel Sensing Intrinsic Amplitude: 12 mV
Lead Channel Setting Pacing Amplitude: 2.5 V
Lead Channel Setting Pacing Pulse Width: 0.4 ms
Lead Channel Setting Sensing Sensitivity: 0.5 mV
Pulse Gen Serial Number: 7410740

## 2019-11-27 NOTE — Progress Notes (Signed)
Remote ICD transmission.   

## 2020-01-15 ENCOUNTER — Other Ambulatory Visit: Payer: Self-pay | Admitting: Internal Medicine

## 2020-01-15 DIAGNOSIS — I10 Essential (primary) hypertension: Secondary | ICD-10-CM

## 2020-02-22 ENCOUNTER — Ambulatory Visit (INDEPENDENT_AMBULATORY_CARE_PROVIDER_SITE_OTHER): Payer: BC Managed Care – PPO

## 2020-02-22 DIAGNOSIS — I469 Cardiac arrest, cause unspecified: Secondary | ICD-10-CM | POA: Diagnosis not present

## 2020-02-22 LAB — CUP PACEART REMOTE DEVICE CHECK
Battery Remaining Longevity: 72 mo
Battery Remaining Percentage: 69 %
Battery Voltage: 2.96 V
Brady Statistic RV Percent Paced: 1 %
Date Time Interrogation Session: 20211007020018
HighPow Impedance: 61 Ohm
HighPow Impedance: 61 Ohm
Implantable Lead Implant Date: 20210106
Implantable Lead Location: 753860
Implantable Lead Model: 7122
Implantable Pulse Generator Implant Date: 20180329
Lead Channel Impedance Value: 400 Ohm
Lead Channel Pacing Threshold Amplitude: 1.25 V
Lead Channel Pacing Threshold Pulse Width: 0.4 ms
Lead Channel Sensing Intrinsic Amplitude: 12 mV
Lead Channel Setting Pacing Amplitude: 2.5 V
Lead Channel Setting Pacing Pulse Width: 0.4 ms
Lead Channel Setting Sensing Sensitivity: 0.5 mV
Pulse Gen Serial Number: 7410740

## 2020-02-26 NOTE — Progress Notes (Signed)
Remote ICD transmission.   

## 2020-05-23 ENCOUNTER — Ambulatory Visit (INDEPENDENT_AMBULATORY_CARE_PROVIDER_SITE_OTHER): Payer: BC Managed Care – PPO

## 2020-05-23 DIAGNOSIS — I469 Cardiac arrest, cause unspecified: Secondary | ICD-10-CM | POA: Diagnosis not present

## 2020-05-23 LAB — CUP PACEART REMOTE DEVICE CHECK
Battery Remaining Longevity: 70 mo
Battery Remaining Percentage: 67 %
Battery Voltage: 2.96 V
Brady Statistic RV Percent Paced: 1 %
Date Time Interrogation Session: 20220106020017
HighPow Impedance: 56 Ohm
HighPow Impedance: 56 Ohm
Implantable Lead Implant Date: 20210106
Implantable Lead Location: 753860
Implantable Lead Model: 7122
Implantable Pulse Generator Implant Date: 20180329
Lead Channel Impedance Value: 400 Ohm
Lead Channel Pacing Threshold Amplitude: 1.25 V
Lead Channel Pacing Threshold Pulse Width: 0.4 ms
Lead Channel Sensing Intrinsic Amplitude: 12 mV
Lead Channel Setting Pacing Amplitude: 2.5 V
Lead Channel Setting Pacing Pulse Width: 0.4 ms
Lead Channel Setting Sensing Sensitivity: 0.5 mV
Pulse Gen Serial Number: 7410740

## 2020-06-06 NOTE — Progress Notes (Signed)
Remote ICD transmission.   

## 2020-08-22 ENCOUNTER — Ambulatory Visit (INDEPENDENT_AMBULATORY_CARE_PROVIDER_SITE_OTHER): Payer: BC Managed Care – PPO

## 2020-08-22 DIAGNOSIS — I469 Cardiac arrest, cause unspecified: Secondary | ICD-10-CM | POA: Diagnosis not present

## 2020-08-22 LAB — CUP PACEART REMOTE DEVICE CHECK
Battery Remaining Longevity: 67 mo
Battery Remaining Percentage: 65 %
Battery Voltage: 2.96 V
Brady Statistic RV Percent Paced: 1 %
Date Time Interrogation Session: 20220407021341
HighPow Impedance: 56 Ohm
HighPow Impedance: 56 Ohm
Implantable Lead Implant Date: 20210106
Implantable Lead Location: 753860
Implantable Lead Model: 7122
Implantable Pulse Generator Implant Date: 20180329
Lead Channel Impedance Value: 410 Ohm
Lead Channel Pacing Threshold Amplitude: 1.25 V
Lead Channel Pacing Threshold Pulse Width: 0.4 ms
Lead Channel Sensing Intrinsic Amplitude: 12 mV
Lead Channel Setting Pacing Amplitude: 2.5 V
Lead Channel Setting Pacing Pulse Width: 0.4 ms
Lead Channel Setting Sensing Sensitivity: 0.5 mV
Pulse Gen Serial Number: 7410740

## 2020-09-04 NOTE — Progress Notes (Signed)
Remote ICD transmission.   

## 2020-11-21 ENCOUNTER — Ambulatory Visit (INDEPENDENT_AMBULATORY_CARE_PROVIDER_SITE_OTHER): Payer: BC Managed Care – PPO

## 2020-11-21 DIAGNOSIS — I469 Cardiac arrest, cause unspecified: Secondary | ICD-10-CM | POA: Diagnosis not present

## 2020-11-21 LAB — CUP PACEART REMOTE DEVICE CHECK
Battery Remaining Longevity: 66 mo
Battery Remaining Percentage: 63 %
Battery Voltage: 2.96 V
Brady Statistic RV Percent Paced: 1 %
Date Time Interrogation Session: 20220707020024
HighPow Impedance: 55 Ohm
HighPow Impedance: 55 Ohm
Implantable Lead Implant Date: 20210106
Implantable Lead Location: 753860
Implantable Lead Model: 7122
Implantable Pulse Generator Implant Date: 20180329
Lead Channel Impedance Value: 430 Ohm
Lead Channel Pacing Threshold Amplitude: 1.25 V
Lead Channel Pacing Threshold Pulse Width: 0.4 ms
Lead Channel Sensing Intrinsic Amplitude: 12 mV
Lead Channel Setting Pacing Amplitude: 2.5 V
Lead Channel Setting Pacing Pulse Width: 0.4 ms
Lead Channel Setting Sensing Sensitivity: 0.5 mV
Pulse Gen Serial Number: 7410740

## 2020-12-13 NOTE — Progress Notes (Signed)
Remote ICD transmission.   

## 2021-01-07 ENCOUNTER — Other Ambulatory Visit: Payer: Self-pay | Admitting: Internal Medicine

## 2021-01-07 DIAGNOSIS — I10 Essential (primary) hypertension: Secondary | ICD-10-CM

## 2021-02-20 ENCOUNTER — Ambulatory Visit (INDEPENDENT_AMBULATORY_CARE_PROVIDER_SITE_OTHER): Payer: BC Managed Care – PPO

## 2021-02-20 DIAGNOSIS — I469 Cardiac arrest, cause unspecified: Secondary | ICD-10-CM

## 2021-02-20 LAB — CUP PACEART REMOTE DEVICE CHECK
Battery Remaining Longevity: 64 mo
Battery Remaining Percentage: 61 %
Battery Voltage: 2.96 V
Brady Statistic RV Percent Paced: 1 %
Date Time Interrogation Session: 20221006020016
HighPow Impedance: 53 Ohm
HighPow Impedance: 53 Ohm
Implantable Lead Implant Date: 20210106
Implantable Lead Location: 753860
Implantable Lead Model: 7122
Implantable Pulse Generator Implant Date: 20180329
Lead Channel Impedance Value: 430 Ohm
Lead Channel Pacing Threshold Amplitude: 1.25 V
Lead Channel Pacing Threshold Pulse Width: 0.4 ms
Lead Channel Sensing Intrinsic Amplitude: 12 mV
Lead Channel Setting Pacing Amplitude: 2.5 V
Lead Channel Setting Pacing Pulse Width: 0.4 ms
Lead Channel Setting Sensing Sensitivity: 0.5 mV
Pulse Gen Serial Number: 7410740

## 2021-03-03 NOTE — Progress Notes (Signed)
Remote ICD transmission.   

## 2021-05-22 ENCOUNTER — Ambulatory Visit (INDEPENDENT_AMBULATORY_CARE_PROVIDER_SITE_OTHER): Payer: BC Managed Care – PPO

## 2021-05-22 DIAGNOSIS — I469 Cardiac arrest, cause unspecified: Secondary | ICD-10-CM | POA: Diagnosis not present

## 2021-05-22 LAB — CUP PACEART REMOTE DEVICE CHECK
Battery Remaining Longevity: 62 mo
Battery Remaining Percentage: 60 %
Battery Voltage: 2.96 V
Brady Statistic RV Percent Paced: 1 %
Date Time Interrogation Session: 20230105020016
HighPow Impedance: 57 Ohm
HighPow Impedance: 57 Ohm
Implantable Lead Implant Date: 20210106
Implantable Lead Location: 753860
Implantable Lead Model: 7122
Implantable Pulse Generator Implant Date: 20180329
Lead Channel Impedance Value: 430 Ohm
Lead Channel Pacing Threshold Amplitude: 1.25 V
Lead Channel Pacing Threshold Pulse Width: 0.4 ms
Lead Channel Sensing Intrinsic Amplitude: 12 mV
Lead Channel Setting Pacing Amplitude: 2.5 V
Lead Channel Setting Pacing Pulse Width: 0.4 ms
Lead Channel Setting Sensing Sensitivity: 0.5 mV
Pulse Gen Serial Number: 7410740

## 2021-06-02 NOTE — Progress Notes (Signed)
Remote ICD transmission.   

## 2021-08-21 ENCOUNTER — Ambulatory Visit (INDEPENDENT_AMBULATORY_CARE_PROVIDER_SITE_OTHER): Payer: BC Managed Care – PPO

## 2021-08-21 DIAGNOSIS — I469 Cardiac arrest, cause unspecified: Secondary | ICD-10-CM | POA: Diagnosis not present

## 2021-08-21 LAB — CUP PACEART REMOTE DEVICE CHECK
Battery Remaining Longevity: 60 mo
Battery Remaining Percentage: 57 %
Battery Voltage: 2.95 V
Brady Statistic RV Percent Paced: 1 %
Date Time Interrogation Session: 20230406020015
HighPow Impedance: 55 Ohm
HighPow Impedance: 55 Ohm
Implantable Lead Implant Date: 20210106
Implantable Lead Location: 753860
Implantable Lead Model: 7122
Implantable Pulse Generator Implant Date: 20180329
Lead Channel Impedance Value: 430 Ohm
Lead Channel Pacing Threshold Amplitude: 1.25 V
Lead Channel Pacing Threshold Pulse Width: 0.4 ms
Lead Channel Sensing Intrinsic Amplitude: 12 mV
Lead Channel Setting Pacing Amplitude: 2.5 V
Lead Channel Setting Pacing Pulse Width: 0.4 ms
Lead Channel Setting Sensing Sensitivity: 0.5 mV
Pulse Gen Serial Number: 7410740

## 2021-09-05 NOTE — Progress Notes (Signed)
Remote ICD transmission.   

## 2021-09-15 ENCOUNTER — Ambulatory Visit (INDEPENDENT_AMBULATORY_CARE_PROVIDER_SITE_OTHER): Payer: BC Managed Care – PPO | Admitting: Nurse Practitioner

## 2021-09-15 ENCOUNTER — Encounter: Payer: Self-pay | Admitting: Nurse Practitioner

## 2021-09-15 VITALS — BP 138/88 | HR 78 | Temp 98.2°F | Ht 68.0 in | Wt 171.4 lb

## 2021-09-15 DIAGNOSIS — I4901 Ventricular fibrillation: Secondary | ICD-10-CM | POA: Diagnosis not present

## 2021-09-15 DIAGNOSIS — R7303 Prediabetes: Secondary | ICD-10-CM

## 2021-09-15 DIAGNOSIS — I10 Essential (primary) hypertension: Secondary | ICD-10-CM | POA: Diagnosis not present

## 2021-09-15 DIAGNOSIS — Z1329 Encounter for screening for other suspected endocrine disorder: Secondary | ICD-10-CM

## 2021-09-15 DIAGNOSIS — Z1211 Encounter for screening for malignant neoplasm of colon: Secondary | ICD-10-CM

## 2021-09-15 DIAGNOSIS — Z8674 Personal history of sudden cardiac arrest: Secondary | ICD-10-CM | POA: Diagnosis not present

## 2021-09-15 DIAGNOSIS — Z0000 Encounter for general adult medical examination without abnormal findings: Secondary | ICD-10-CM

## 2021-09-15 NOTE — Assessment & Plan Note (Addendum)
History of V-fib with cardiac arrest in 2009.  He follows with cardiology once a year and has an ICD in place.  He states that he has never had his ICD fire since getting it implanted.  He is currently taking metoprolol 25 mg twice daily and aspirin 81 mg daily.  Continue collaboration recommendations from cardiology. ?

## 2021-09-15 NOTE — Patient Instructions (Signed)
It was great to see you! ? ?We are checking your labs today and will let you know the results via mychart/phone.  ? ?I have ordered cologuard that will be sent to your house to screen for colon cancer. ? ?Let's follow-up in 1 year, sooner if you have concerns. ? ?If a referral was placed today, you will be contacted for an appointment. Please note that routine referrals can sometimes take up to 3-4 weeks to process. Please call our office if you haven't heard anything after this time frame. ? ?Take care, ? ?Vance Peper, NP ? ?

## 2021-09-15 NOTE — Assessment & Plan Note (Signed)
History of cardiac arrest due to V-fib in 2009.  He continues to follow with cardiology yearly and has an ICD in place.  Continue collaboration recommendations from cardiology. ?

## 2021-09-15 NOTE — Assessment & Plan Note (Signed)
Chronic, stable.  Blood pressure slightly elevated today at 138/88.  He has been exercising routinely and trying to limit the amount of salt he is eating.  Check CMP, CBC, fasting lipid panel today.  Follow-up in 1 year ?

## 2021-09-15 NOTE — Assessment & Plan Note (Signed)
He has a history of elevated A1c.  He states that he does not remember what his last A1c was.  We will check A1c today and treat based on results. ?

## 2021-09-15 NOTE — Progress Notes (Signed)
? ?New Patient Office Visit ? ?Subjective   ? ?Patient ID: Brian Arellano, male    DOB: [birth year]  Age: 46 y.o. MRN: [id number] ? ?CC:  ?Chief Complaint  ?Patient presents with  ? Establish Care  ?  Np. Est care. Pt requesting CPE. No main concerns,  ? ? ?HPI ?Brian Arellano presents for new patient visit to establish care.  Introduced to Publishing rights manager role and practice setting.  All questions answered.  Discussed provider/patient relationship and expectations. ? ?He had a cardiac arrest on 09/18/07. He stated that it was sudden cardiac arrest. Cardiology told him it was possible a genetic condition. He has a defibrillator placed and it has never fired. He tries to see cardiology once a year. He takes metoprolol 25mg  BID and aspirin 81mg  daily.  ? ?He has a history of prediabetes. He has been trying to change his diet to help control his sugars. He also exercises routinely.  ? ?Depression and Anxiety Screen: ? ? ?  09/15/2021  ?  2:56 PM  ?Depression screen PHQ 2/9  ?Decreased Interest 0  ?Down, Depressed, Hopeless 0  ?PHQ - 2 Score 0  ?Altered sleeping 0  ?Tired, decreased energy 0  ?Change in appetite 0  ?Feeling bad or failure about yourself  0  ?Trouble concentrating 0  ?Moving slowly or fidgety/restless 0  ?Suicidal thoughts 0  ?PHQ-9 Score 0  ?Difficult doing work/chores Not difficult at all  ? ? ?  09/15/2021  ?  2:56 PM  ?GAD 7 : Generalized Anxiety Score  ?Nervous, Anxious, on Edge 0  ?Control/stop worrying 0  ?Worry too much - different things 0  ?Trouble relaxing 0  ?Restless 0  ?Easily annoyed or irritable 0  ?Afraid - awful might happen 0  ?Total GAD 7 Score 0  ?Anxiety Difficulty Not difficult at all  ? ? ?Outpatient Encounter Medications as of 09/15/2021  ?Medication Sig  ? aspirin 81 MG tablet Take 81 mg by mouth daily.    ? metoprolol tartrate (LOPRESSOR) 25 MG tablet Take 1 tablet (25 mg total) by mouth 2 (two) times daily. Patient overdue for an appointment and needs to call and  schedule for further refills 1st attempt  ? Multiple Vitamins-Minerals (MULTIVITAMIN WITH MINERALS) tablet Take 1 tablet by mouth daily.  ? Potassium 95 MG TABS Take 95 mg by mouth daily.   ? ?No facility-administered encounter medications on file as of 09/15/2021.  ? ? ?Past Medical History:  ?Diagnosis Date  ? Allergic rhinitis, seasonal   ? Cardiac arrest (HCC)   ? Cardiac arrest - ventricular fibrillation   ? Prediabetes   ? Presence of permanent cardiac pacemaker   ? ? ?Past Surgical History:  ?Procedure Laterality Date  ? CARDIAC DEFIBRILLATOR PLACEMENT    ? ICD St Jude  ? ICD GENERATOR CHANGEOUT N/A 08/13/2016  ? Procedure: ICD Generator Changeout;  Surgeon: 11/15/2021, MD;  Location: [facility] INVASIVE CV LAB;  Service: Cardiovascular;  Laterality: N/A;  ? PACEMAKER LEAD REMOVAL Right 05/24/2019  ? Procedure: PACEMAKER LEAD REMOVAL, REIMPLANT;  Surgeon: [facility], MD;  Location: [facility] OR;  Service: Cardiovascular;  Laterality: Right;  ? ? ?Family History  ?Problem Relation Age of Onset  ? Cancer Mother   ?     breast  ? Coronary artery disease Father   ?     cabg x2  ? Atrial fibrillation Cousin 25  ? Heart disease Other   ?     father's brother,  bypass surgery/father's brother, bypass surgery  ? Coronary artery disease Other   ?     family history  ? ? ?Social History  ? ?Socioeconomic History  ? Marital status: Married  ?  Spouse name: Not on file  ? Number of children: Not on file  ? Years of education: Not on file  ? Highest education level: Not on file  ?Occupational History  ? Not on file  ?Tobacco Use  ? Smoking status: Never  ? Smokeless tobacco: Never  ?Vaping Use  ? Vaping Use: Never used  ?Substance and Sexual Activity  ? Alcohol use: No  ? Drug use: No  ? Sexual activity: Not on file  ?Other Topics Concern  ? Not on file  ?Social History Narrative  ? Not on file  ? ?Social Determinants of Health  ? ?Financial Resource Strain: Not on file  ?Food Insecurity: Not on file  ?Transportation Needs: Not on  file  ?Physical Activity: Not on file  ?Stress: Not on file  ?Social Connections: Not on file  ?Intimate Partner Violence: Not on file  ? ? ?Review of Systems  ?Constitutional:  Positive for malaise/fatigue.  ?HENT: Negative.    ?Eyes: Negative.   ?Respiratory: Negative.    ?Cardiovascular: Negative.   ?Gastrointestinal: Negative.   ?Genitourinary: Negative.   ?Musculoskeletal: Negative.   ?Skin: Negative.   ?Neurological: Negative.   ?Psychiatric/Behavioral: Negative.    ? ?  ?Objective   ? ?BP 138/88 (BP Location: Left Arm, Patient Position: Sitting, Cuff Size: Normal)   Pulse 78   Temp 98.2 ?F (36.8 ?C) (Temporal)   Ht 5\' 8"  (1.727 m)   Wt 171 lb 6.4 oz (77.7 kg)   SpO2 97%   BMI 26.06 kg/m?  ? ?Physical Exam ?Vitals and nursing note reviewed.  ?Constitutional:   ?   General: He is not in acute distress. ?   Appearance: Normal appearance.  ?HENT:  ?   Head: Normocephalic and atraumatic.  ?   Right Ear: Tympanic membrane, ear canal and external ear normal.  ?   Left Ear: Tympanic membrane, ear canal and external ear normal.  ?Eyes:  ?   Conjunctiva/sclera: Conjunctivae normal.  ?Cardiovascular:  ?   Rate and Rhythm: Normal rate and regular rhythm.  ?   Pulses: Normal pulses.  ?   Heart sounds: Normal heart sounds.  ?Pulmonary:  ?   Effort: Pulmonary effort is normal.  ?   Breath sounds: Normal breath sounds.  ?Abdominal:  ?   Palpations: Abdomen is soft.  ?   Tenderness: There is no abdominal tenderness.  ?Musculoskeletal:     ?   General: Normal range of motion.  ?   Cervical back: Normal range of motion and neck supple. No tenderness.  ?   Right lower leg: No edema.  ?   Left lower leg: No edema.  ?   Comments: Strength 5/5 in bilateral upper and lower extremities  ?Lymphadenopathy:  ?   Cervical: No cervical adenopathy.  ?Skin: ?   General: Skin is warm and dry.  ?Neurological:  ?   General: No focal deficit present.  ?   Mental Status: He is alert and oriented to person, place, and time.  ?   Cranial  Nerves: No cranial nerve deficit.  ?   Coordination: Coordination normal.  ?   Gait: Gait normal.  ?Psychiatric:     ?   Mood and Affect: Mood normal.     ?   Behavior:  Behavior normal.     ?   Thought Content: Thought content normal.     ?   Judgment: Judgment normal.  ? ? ?Last CBC ?Lab Results  ?Component Value Date  ? WBC 6.1 05/22/2019  ? HGB 16.1 05/22/2019  ? HCT 48.2 05/22/2019  ? MCV 90.8 05/22/2019  ? MCH 30.3 05/22/2019  ? RDW 13.4 05/22/2019  ? PLT 150 05/22/2019  ? ?Last metabolic panel ?Lab Results  ?Component Value Date  ? GLUCOSE 96 05/24/2019  ? NA 137 05/24/2019  ? K 4.0 05/24/2019  ? CL 103 05/24/2019  ? CO2 26 05/24/2019  ? BUN 14 05/24/2019  ? CREATININE 1.03 05/24/2019  ? GFRNONAA >60 05/24/2019  ? CALCIUM 9.1 05/24/2019  ? ANIONGAP 8 05/24/2019  ? ?  ? ?Assessment & Plan:  ? ?Problem List Items Addressed This Visit   ? ?  ? Cardiovascular and Mediastinum  ? Primary hypertension  ?  Chronic, stable.  Blood pressure slightly elevated today at 138/88.  He has been exercising routinely and trying to limit the amount of salt he is eating.  Check CMP, CBC, fasting lipid panel today.  Follow-up in 1 year ? ?  ?  ? Relevant Orders  ? CBC with Differential/Platelet  ? Comprehensive metabolic panel  ? Lipid panel  ? TSH  ? VF (ventricular fibrillation) (HCC)  ?  History of V-fib with cardiac arrest in 2009.  He follows with cardiology once a year and has an ICD in place.  He states that he has never had his ICD fire since getting it implanted.  He is currently taking metoprolol 25 mg twice daily and aspirin 81 mg daily.  Continue collaboration recommendations from cardiology. ? ?  ?  ?  ? Other  ? History of cardiac arrest  ?  History of cardiac arrest due to V-fib in 2009.  He continues to follow with cardiology yearly and has an ICD in place.  Continue collaboration recommendations from cardiology. ? ?  ?  ? Prediabetes  ?  He has a history of elevated A1c.  He states that he does not remember what  his last A1c was.  We will check A1c today and treat based on results. ? ?  ?  ? Relevant Orders  ? Hemoglobin A1c  ? ?Other Visit Diagnoses   ? ? Routine general medical examination at a health care facility    -  [facility]

## 2021-09-16 LAB — LIPID PANEL
Cholesterol: 152 mg/dL (ref 0–200)
HDL: 38.2 mg/dL — ABNORMAL LOW (ref >39.00)
LDL Cholesterol: 96 mg/dL (ref 0–99)
NonHDL: 113.42
Total CHOL/HDL Ratio: 4
Triglycerides: 86 mg/dL (ref 0.0–149.0)
VLDL: 17.2 mg/dL (ref 0.0–40.0)

## 2021-09-16 LAB — COMPREHENSIVE METABOLIC PANEL
ALT: 13 U/L (ref 0–53)
AST: 15 U/L (ref 0–37)
Albumin: 4.8 g/dL (ref 3.5–5.2)
Alkaline Phosphatase: 68 U/L (ref 39–117)
BUN: 20 mg/dL (ref 6–23)
CO2: 25 mEq/L (ref 19–32)
Calcium: 9.5 mg/dL (ref 8.4–10.5)
Chloride: 101 mEq/L (ref 96–112)
Creatinine, Ser: 0.96 mg/dL (ref 0.40–1.50)
GFR: 94.45 mL/min (ref >60.00)
Glucose, Bld: 82 mg/dL (ref 70–99)
Potassium: 3.8 mEq/L (ref 3.5–5.1)
Sodium: 137 mEq/L (ref 135–145)
Total Bilirubin: 0.7 mg/dL (ref 0.2–1.2)
Total Protein: 7.5 g/dL (ref 6.0–8.3)

## 2021-09-16 LAB — CBC WITH DIFFERENTIAL/PLATELET
Basophils Absolute: 0 10*3/uL (ref 0.0–0.1)
Basophils Relative: 0.4 % (ref 0.0–3.0)
Eosinophils Absolute: 0 10*3/uL (ref 0.0–0.7)
Eosinophils Relative: 0.6 % (ref 0.0–5.0)
HCT: 44.6 % (ref 39.0–52.0)
Hemoglobin: 15.1 g/dL (ref 13.0–17.0)
Lymphocytes Relative: 13.1 % (ref 12.0–46.0)
Lymphs Abs: 1 10*3/uL (ref 0.7–4.0)
MCHC: 33.9 g/dL (ref 30.0–36.0)
MCV: 89.9 fl (ref 78.0–100.0)
Monocytes Absolute: 0.6 10*3/uL (ref 0.1–1.0)
Monocytes Relative: 7.4 % (ref 3.0–12.0)
Neutro Abs: 6.2 10*3/uL (ref 1.4–7.7)
Neutrophils Relative %: 78.5 % — ABNORMAL HIGH (ref 43.0–77.0)
Platelets: 123 10*3/uL — ABNORMAL LOW (ref 150.0–400.0)
RBC: 4.96 Mil/uL (ref 4.22–5.81)
RDW: 14 % (ref 11.5–15.5)
WBC: 7.9 10*3/uL (ref 4.0–10.5)

## 2021-09-16 LAB — TSH: TSH: 2.16 u[IU]/mL (ref 0.35–5.50)

## 2021-09-16 LAB — HEMOGLOBIN A1C: Hgb A1c MFr Bld: 5.8 % (ref 4.6–6.5)

## 2021-10-07 IMAGING — CR DG CHEST 2V
2 series · 2 of 2 positions shown · non-contrast
Comparison: None.

CLINICAL DATA: ICD insertion

EXAM:
CHEST - 2 VIEW

[chest lat]
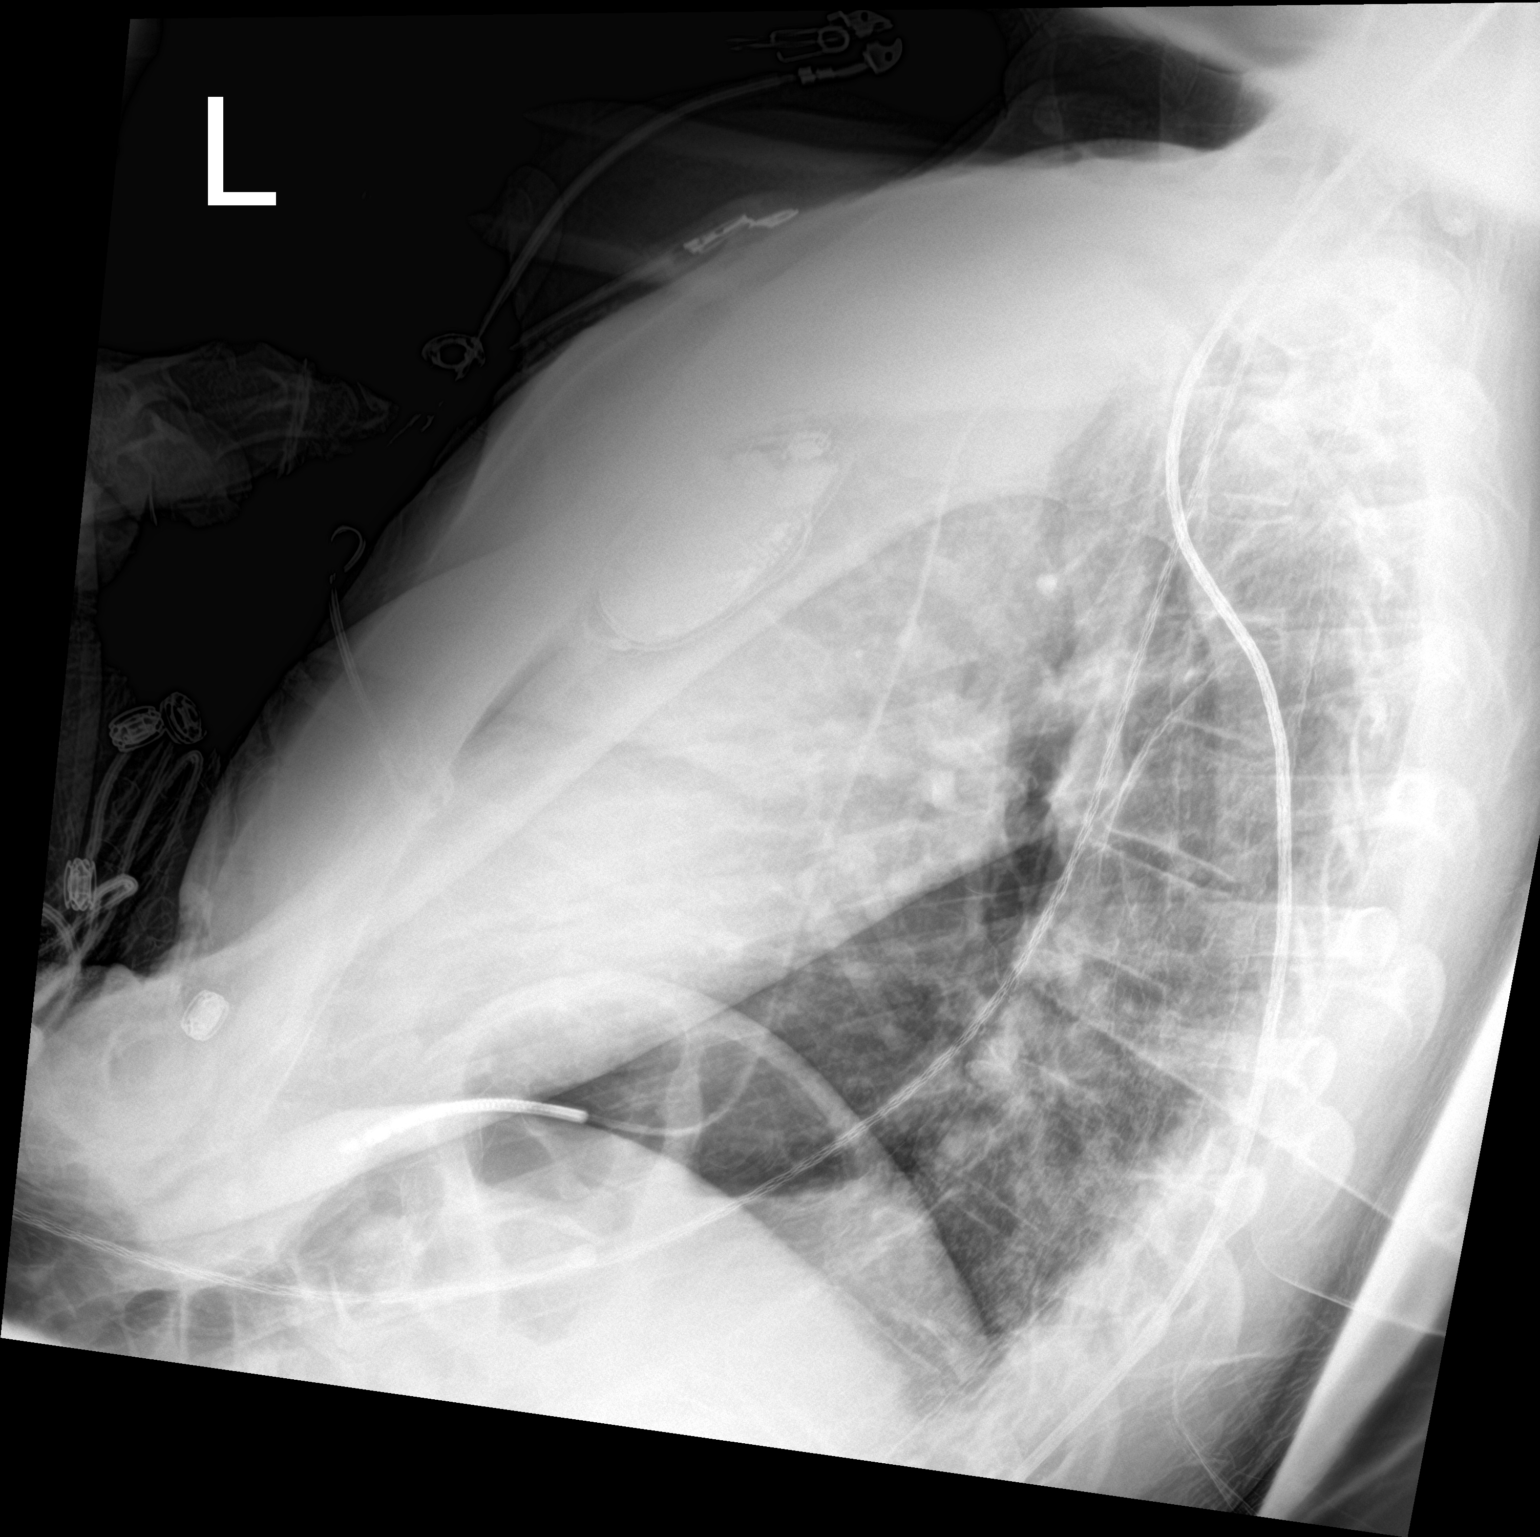

[chest ap]
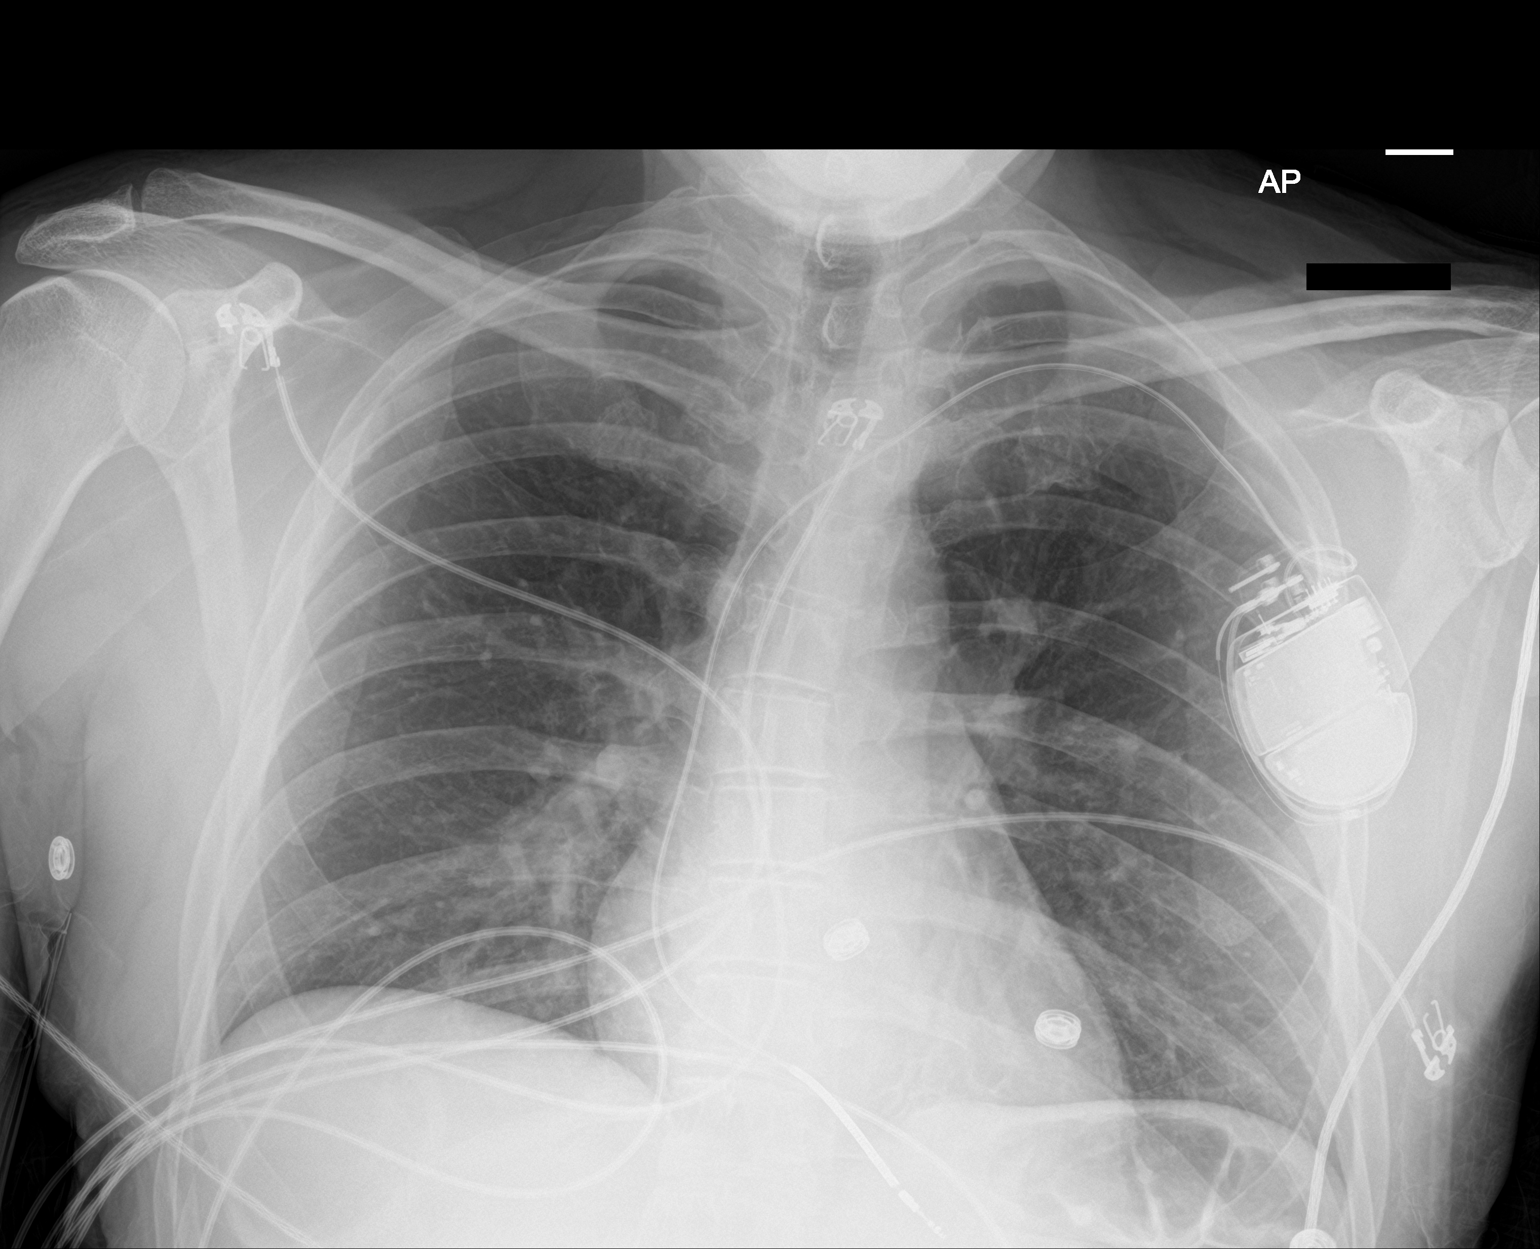

[2 of 2 positions shown; findings below may reference images not displayed]

FINDINGS: Revision of the left single lead AICD. Normal heart size and
vascularity. Lungs remain clear. No focal pneumonia, collapse or
consolidation. Negative for edema, effusion or pneumothorax. Trachea
midline.
IMPRESSION: Interval AICD revision.  No acute chest process.

## 2021-11-20 ENCOUNTER — Ambulatory Visit (INDEPENDENT_AMBULATORY_CARE_PROVIDER_SITE_OTHER): Payer: BC Managed Care – PPO

## 2021-11-20 DIAGNOSIS — I469 Cardiac arrest, cause unspecified: Secondary | ICD-10-CM

## 2021-11-22 LAB — CUP PACEART REMOTE DEVICE CHECK
Battery Remaining Longevity: 58 mo
Battery Remaining Percentage: 56 %
Battery Voltage: 2.95 V
Brady Statistic RV Percent Paced: 1 %
Date Time Interrogation Session: 20230706020019
HighPow Impedance: 63 Ohm
HighPow Impedance: 63 Ohm
Implantable Lead Implant Date: 20210106
Implantable Lead Location: 753860
Implantable Lead Model: 7122
Implantable Pulse Generator Implant Date: 20180329
Lead Channel Impedance Value: 430 Ohm
Lead Channel Pacing Threshold Amplitude: 1.25 V
Lead Channel Pacing Threshold Pulse Width: 0.4 ms
Lead Channel Sensing Intrinsic Amplitude: 12 mV
Lead Channel Setting Pacing Amplitude: 2.5 V
Lead Channel Setting Pacing Pulse Width: 0.4 ms
Lead Channel Setting Sensing Sensitivity: 0.5 mV
Pulse Gen Serial Number: 7410740

## 2021-12-09 NOTE — Progress Notes (Signed)
Remote ICD transmission.   

## 2022-01-08 NOTE — Progress Notes (Signed)
Called and ldvm

## 2022-02-19 ENCOUNTER — Ambulatory Visit (INDEPENDENT_AMBULATORY_CARE_PROVIDER_SITE_OTHER): Payer: BC Managed Care – PPO

## 2022-02-19 DIAGNOSIS — I469 Cardiac arrest, cause unspecified: Secondary | ICD-10-CM | POA: Diagnosis not present

## 2022-02-23 LAB — CUP PACEART REMOTE DEVICE CHECK
Battery Remaining Longevity: 55 mo
Battery Remaining Percentage: 53 %
Battery Voltage: 2.95 V
Brady Statistic RV Percent Paced: 1 %
Date Time Interrogation Session: 20231008211454
HighPow Impedance: 52 Ohm
HighPow Impedance: 52 Ohm
Implantable Lead Implant Date: 20210106
Implantable Lead Location: 753860
Implantable Lead Model: 7122
Implantable Pulse Generator Implant Date: 20180329
Lead Channel Impedance Value: 400 Ohm
Lead Channel Pacing Threshold Amplitude: 1.25 V
Lead Channel Pacing Threshold Pulse Width: 0.4 ms
Lead Channel Sensing Intrinsic Amplitude: 12 mV
Lead Channel Setting Pacing Amplitude: 2.5 V
Lead Channel Setting Pacing Pulse Width: 0.4 ms
Lead Channel Setting Sensing Sensitivity: 0.5 mV
Pulse Gen Serial Number: 7410740

## 2022-02-26 NOTE — Progress Notes (Signed)
Remote ICD transmission.   

## 2022-05-21 ENCOUNTER — Ambulatory Visit (INDEPENDENT_AMBULATORY_CARE_PROVIDER_SITE_OTHER): Payer: BC Managed Care – PPO

## 2022-05-21 DIAGNOSIS — I469 Cardiac arrest, cause unspecified: Secondary | ICD-10-CM | POA: Diagnosis not present

## 2022-05-21 LAB — CUP PACEART REMOTE DEVICE CHECK
Battery Remaining Longevity: 54 mo
Battery Remaining Percentage: 52 %
Battery Voltage: 2.95 V
Brady Statistic RV Percent Paced: 1 %
Date Time Interrogation Session: 20240104020018
HighPow Impedance: 62 Ohm
HighPow Impedance: 62 Ohm
Implantable Lead Connection Status: 753985
Implantable Lead Implant Date: 20210106
Implantable Lead Location: 753860
Implantable Lead Model: 7122
Implantable Pulse Generator Implant Date: 20180329
Lead Channel Impedance Value: 410 Ohm
Lead Channel Pacing Threshold Amplitude: 1.25 V
Lead Channel Pacing Threshold Pulse Width: 0.4 ms
Lead Channel Sensing Intrinsic Amplitude: 12 mV
Lead Channel Setting Pacing Amplitude: 2.5 V
Lead Channel Setting Pacing Pulse Width: 0.4 ms
Lead Channel Setting Sensing Sensitivity: 0.5 mV
Pulse Gen Serial Number: 7410740
Zone Setting Status: 755011

## 2022-06-11 NOTE — Progress Notes (Signed)
Remote ICD transmission.   

## 2022-07-13 ENCOUNTER — Telehealth: Payer: Self-pay | Admitting: *Deleted

## 2022-07-13 NOTE — Telephone Encounter (Signed)
   Name: Brian Arellano  DOB: 09-14-1975  MRN: [id number]  Primary Cardiologist: Dr. Lovena Le   This is a low risk dental procedure which per guidelines does not require any specific cardiac clearance. However, we are being asked to provide medical clearance and we have not seen the patient since 2021. Therefore, patient will need to be seen in the office before we can do this.  Pre-op covering staff: - Please schedule appointment and call patient to inform them. If patient already had an upcoming appointment within acceptable timeframe, please add "pre-op clearance" to the appointment notes so provider is aware. - Please contact requesting surgeon's office via preferred method (i.e, phone, fax) to inform them of need for appointment prior to surgery.  Darreld Mclean, PA-C  07/13/2022, 2:54 PM

## 2022-07-13 NOTE — Telephone Encounter (Signed)
I called the pt and left a message to call back to schedule an in office appt with EP APP. At the moment I had called the pt I left message that I have an appt 07/15/22 @ 8:25. I will update the requesting office that the pt is going to need an in office appt. I s/w Kayla at the dental office and informed them the pt is going to need an appt in the office as he was last seen 08/2019. Lonn Georgia thanked me for the call and they will cancel his appt with them for tomorrow.

## 2022-07-13 NOTE — Telephone Encounter (Signed)
   Pre-operative Risk Assessment    Patient Name: Brian Brian  DOB: 1975/10/04 MRN: [id number]     Request for Surgical Clearance    Procedure:   I S/W THE DENTAL OFFICE FOR CONFIRMATION OF PROCEDURE; PER DENTAL OFFICE PROCEDURE WILL BE A SCAN OF INSIDE OF PT'S MOUTH TO PREPARE FOR INVISALIGN TRAYS; PT WILL BE HAVING A TYPE OF GLUE ADHESIVE PLACED TO THE TEETH WHICH WILL HELP THE INVISALIGN TRAYS TO STAY IN PLACE.      Date of Surgery:  Clearance 07/14/22                                 Surgeon:  DR. Lyda Arellano, DMD Surgeon's Group or Practice Name:  [facility] Phone number:  [phone] Fax number:  [phone]   Type of Clearance Requested:   - Medical ; NO MEDICATIONS NEEDING TO BE HELD. THOUGH NEEDS TO KNOW IF PT IS GOING TO NEED SBE   Type of Anesthesia:  None    Additional requests/questions:    Brian Brian   07/13/2022, 2:32 PM

## 2022-07-14 NOTE — Telephone Encounter (Signed)
Message has been sent to [place], EP scheduler to see if she can reach out to the pt with an appt.

## 2022-07-16 NOTE — Telephone Encounter (Signed)
I have sent another message today to EP scheduler to set up appt an appt for pre op clearance.

## 2022-07-16 NOTE — Telephone Encounter (Signed)
Both [place], EP scheduler and I have been trying to reach the pt to schedule an appt in office for pre op clearance. Pt was last 08/2019. I will send FYI to requesting office, the pt is going to need an appt in the office as we last saw pt 2021. Pt has not returned our calls.

## 2022-07-21 ENCOUNTER — Encounter: Payer: Self-pay | Admitting: Internal Medicine

## 2022-08-18 ENCOUNTER — Other Ambulatory Visit: Payer: Self-pay | Admitting: Internal Medicine

## 2022-08-18 DIAGNOSIS — I10 Essential (primary) hypertension: Secondary | ICD-10-CM

## 2022-08-19 NOTE — Telephone Encounter (Signed)
Patient is requesting metoprolol refills, hasn't been seen since 4/21. Attempted to contact patient to schedule an office visit, left message to call back. Needs to be scheduled with Dr. Lovena Le for his SJ ICD defib check.  Not sending in a refill until visit scheduled, awaiting patient call back.

## 2022-08-20 ENCOUNTER — Ambulatory Visit (INDEPENDENT_AMBULATORY_CARE_PROVIDER_SITE_OTHER): Payer: BC Managed Care – PPO

## 2022-08-20 DIAGNOSIS — I469 Cardiac arrest, cause unspecified: Secondary | ICD-10-CM

## 2022-08-20 LAB — CUP PACEART REMOTE DEVICE CHECK
Battery Remaining Longevity: 52 mo
Battery Remaining Percentage: 49 %
Battery Voltage: 2.93 V
Brady Statistic RV Percent Paced: 1 %
Date Time Interrogation Session: 20240404020015
HighPow Impedance: 59 Ohm
HighPow Impedance: 59 Ohm
Implantable Lead Connection Status: 753985
Implantable Lead Implant Date: 20210106
Implantable Lead Location: 753860
Implantable Lead Model: 7122
Implantable Pulse Generator Implant Date: 20180329
Lead Channel Impedance Value: 410 Ohm
Lead Channel Pacing Threshold Amplitude: 1.25 V
Lead Channel Pacing Threshold Pulse Width: 0.4 ms
Lead Channel Sensing Intrinsic Amplitude: 12 mV
Lead Channel Setting Pacing Amplitude: 2.5 V
Lead Channel Setting Pacing Pulse Width: 0.4 ms
Lead Channel Setting Sensing Sensitivity: 0.5 mV
Pulse Gen Serial Number: 7410740
Zone Setting Status: 755011

## 2022-08-21 MED ORDER — METOPROLOL TARTRATE 25 MG PO TABS: 25.0000 mg | ORAL_TABLET | Freq: Two times a day (BID) | ORAL | 0 refills | Status: DC

## 2022-09-16 ENCOUNTER — Other Ambulatory Visit: Payer: Self-pay | Admitting: Internal Medicine

## 2022-09-16 DIAGNOSIS — I10 Essential (primary) hypertension: Secondary | ICD-10-CM

## 2022-09-17 ENCOUNTER — Other Ambulatory Visit: Payer: Self-pay | Admitting: Internal Medicine

## 2022-09-17 ENCOUNTER — Telehealth: Payer: Self-pay

## 2022-09-17 DIAGNOSIS — I10 Essential (primary) hypertension: Secondary | ICD-10-CM

## 2022-09-17 NOTE — Telephone Encounter (Signed)
Called pt and left message informing pt that he was overdue for an appt with Dr. Ladona Ridgel and that  needed to contact the office to schedule an appt with Dr. Ladona Ridgel before anymore refills, because his last appointment was in 2021.

## 2022-09-18 NOTE — Telephone Encounter (Signed)
Pt called and voicemail message left.  Pt advised that [facility] scheduling will be reaching out to schedule a clinic appointment with Dr. Ladona Ridgel.  This is required prior to filling the medication, however, Pt advised to contact us via telephone if he becomes symptomatic due to not having his Lopressor.

## 2022-09-23 NOTE — Progress Notes (Signed)
Remote ICD transmission.   

## 2022-09-24 ENCOUNTER — Ambulatory Visit: Payer: BC Managed Care – PPO | Attending: Internal Medicine | Admitting: Internal Medicine

## 2022-09-24 NOTE — Progress Notes (Signed)
BP (!) 144/90   Pulse 62   Temp (!) 97.5 F (36.4 C)   Ht 5\' 8"  (1.727 m)   Wt 179 lb (81.2 kg)   SpO2 96%   BMI 27.22 kg/m    Subjective:    Patient ID: Brian Arellano, male    DOB: 1975-10-11, 47 y.o.   MRN: [id number]  CC: Chief Complaint  Patient presents with   Annual Exam    With fasting labs, no concerns    HPI: Brian Arellano is a 47 y.o. male presenting on 09/25/2022 for comprehensive medical examination. Current medical complaints include:none  He currently lives with: wife Interim Problems from his last visit: no  Depression and Anxiety Screen done today and results listed below:     09/25/2022   10:10 AM 09/15/2021    2:56 PM  Depression screen PHQ 2/9  Decreased Interest 0 0  Down, Depressed, Hopeless 0 0  PHQ - 2 Score 0 0  Altered sleeping 0 0  Tired, decreased energy 0 0  Change in appetite 0 0  Feeling bad or failure about yourself  0 0  Trouble concentrating 0 0  Moving slowly or fidgety/restless 0 0  Suicidal thoughts 0 0  PHQ-9 Score 0 0  Difficult doing work/chores  Not difficult at all      09/25/2022   10:11 AM 09/15/2021    2:56 PM  GAD 7 : Generalized Anxiety Score  Nervous, Anxious, on Edge 0 0  Control/stop worrying 0 0  Worry too much - different things 0 0  Trouble relaxing 0 0  Restless 0 0  Easily annoyed or irritable 0 0  Afraid - awful might happen 0 0  Total GAD 7 Score 0 0  Anxiety Difficulty  Not difficult at all    The patient does not have a history of falls. I did not complete a risk assessment for falls. A plan of care for falls was not documented.   Past Medical History:  Past Medical History:  Diagnosis Date   Allergic rhinitis, seasonal    Cardiac arrest [facility])    Cardiac arrest - ventricular fibrillation    Prediabetes    Presence of permanent cardiac pacemaker     Surgical History:  Past Surgical History:  Procedure Laterality Date   CARDIAC DEFIBRILLATOR PLACEMENT     ICD St Jude    ICD GENERATOR CHANGEOUT N/A 08/13/2016   Procedure: ICD Generator Changeout;  Surgeon: Marinus Maw, MD;  Location: [facility] INVASIVE CV LAB;  Service: Cardiovascular;  Laterality: N/A;   PACEMAKER LEAD REMOVAL Right 05/24/2019   Procedure: PACEMAKER LEAD REMOVAL, REIMPLANT;  Surgeon: Marinus Maw, MD;  Location: [facility] OR;  Service: Cardiovascular;  Laterality: Right;    Medications:  Current Outpatient Medications on File Prior to Visit  Medication Sig   aspirin 81 MG tablet Take 81 mg by mouth daily.     Multiple Vitamins-Minerals (MULTIVITAMIN WITH MINERALS) tablet Take 1 tablet by mouth daily.   No current facility-administered medications on file prior to visit.    Allergies:  No Known Allergies  Social History:  Social History   Socioeconomic History   Marital status: Married    Spouse name: Not on file   Number of children: Not on file   Years of education: Not on file   Highest education level: Not on file  Occupational History   Not on file  Tobacco Use   Smoking status: Never   Smokeless tobacco:  Never  Vaping Use   Vaping Use: Never used  Substance and Sexual Activity   Alcohol use: No   Drug use: No   Sexual activity: Not on file  Other Topics Concern   Not on file  Social History Narrative   Not on file   Social Determinants of Health   Financial Resource Strain: Not on file  Food Insecurity: Not on file  Transportation Needs: Not on file  Physical Activity: Not on file  Stress: Not on file  Social Connections: Not on file  Intimate Partner Violence: Not on file   Social History   Tobacco Use  Smoking Status Never  Smokeless Tobacco Never   Social History   Substance and Sexual Activity  Alcohol Use No    Family History:  Family History  Problem Relation Age of Onset   Cancer Mother        breast   Coronary artery disease Father        cabg x2   Atrial fibrillation Cousin 25   Heart disease Other        father's brother, bypass  surgery/father's brother, bypass surgery   Coronary artery disease Other        family history    Past medical history, surgical history, medications, allergies, family history and social history reviewed with patient today and changes made to appropriate areas of the chart.   Review of Systems  Constitutional: Negative.   HENT: Negative.    Eyes: Negative.   Respiratory: Negative.    Cardiovascular: Negative.   Gastrointestinal: Negative.   Genitourinary: Negative.   Musculoskeletal: Negative.   Skin: Negative.   Neurological: Negative.   Psychiatric/Behavioral: Negative.     All other ROS negative except what is listed above and in the HPI.      Objective:    BP (!) 144/90   Pulse 62   Temp (!) 97.5 F (36.4 C)   Ht 5\' 8"  (1.727 m)   Wt 179 lb (81.2 kg)   SpO2 96%   BMI 27.22 kg/m   Wt Readings from Last 3 Encounters:  09/25/22 179 lb (81.2 kg)  09/15/21 171 lb 6.4 oz (77.7 kg)  09/13/19 172 lb (78 kg)    Physical Exam Vitals and nursing note reviewed.  Constitutional:      General: He is not in acute distress.    Appearance: Normal appearance.  HENT:     Head: Normocephalic and atraumatic.     Right Ear: Tympanic membrane, ear canal and external ear normal.     Left Ear: Tympanic membrane, ear canal and external ear normal.  Eyes:     Conjunctiva/sclera: Conjunctivae normal.  Cardiovascular:     Rate and Rhythm: Normal rate and regular rhythm.     Pulses: Normal pulses.     Heart sounds: Normal heart sounds.  Pulmonary:     Effort: Pulmonary effort is normal.     Breath sounds: Normal breath sounds.  Abdominal:     Palpations: Abdomen is soft.     Tenderness: There is no abdominal tenderness.  Musculoskeletal:        General: Normal range of motion.     Cervical back: Normal range of motion and neck supple. No tenderness.     Right lower leg: No edema.     Left lower leg: No edema.  Lymphadenopathy:     Cervical: No cervical adenopathy.  Skin:     General: Skin is warm and dry.  Neurological:  General: No focal deficit present.     Mental Status: He is alert and oriented to person, place, and time.     Cranial Nerves: No cranial nerve deficit.     Coordination: Coordination normal.     Gait: Gait normal.  Psychiatric:        Mood and Affect: Mood normal.        Behavior: Behavior normal.        Thought Content: Thought content normal.        Judgment: Judgment normal.     Results for orders placed or performed in visit on 08/20/22  CUP PACEART REMOTE DEVICE CHECK  Result Value Ref Range   Date Time Interrogation Session [id number]    Pulse Generator Manufacturer [employer]    Pulse Gen Model 1411-36C Ellipse VR    Pulse Gen Serial Number [id number]    Cl[facility]    Implantable Pulse Generator Type Implantable Cardiac Defibulator    Implantable Pulse Generator Implant Date 95621308    Implantable Lead Manufacturer [facility]    Implantable Lead Model 7122 Durata    Implantable Lead Serial Number [id number]    Implantable Lead Implant Date 65784696    Implantable Lead Location Detail 1 APEX    Implantable Lead Location [id number]    Implantable Lead Connection Status [id number]    Lead Channel Setting Sensing Sensitivity 0.5 mV   Lead Channel Setting Sensing Adaptation Mode Adaptive Sensing    Lead Channel Setting Pacing Pulse Width 0.4 ms   Lead Channel Setting Pacing Amplitude 2.5 V   Zone Setting Status Active    Zone Setting Status Inactive    Zone Setting Status [phone]    Lead Channel Status NULL    Lead Channel Impedance Value 410 ohm   Lead Channel Sensing Intrinsic Amplitude 12.0 mV   Lead Channel Pacing Threshold Amplitude 1.25 V   Lead Channel Pacing Threshold Pulse Width 0.4 ms   HighPow Impedance 59 ohm   HighPow Impedance 59 ohm   HighPow Imped Status NULL    HighPow Imped Status NULL    Battery Status MOS    Battery Remaining Longevity 52 mo   Battery Remaining Percentage 49.0 %   Battery Voltage  2.93 V   Brady Statistic RV Percent Paced 1.0 %      Assessment & Plan:   Problem List Items Addressed This Visit       Cardiovascular and Mediastinum   Primary hypertension    Chronic, not controlled.  He has been out of his metoprolol for the last week.  Will give him a refill of metoprolol 25 mg twice a day for 30 days as he needs to schedule an appointment with cardiology as soon as possible.  Check CMP, CBC, lipid panel today.  Follow-up 1 year      Relevant Medications   metoprolol tartrate (LOPRESSOR) 25 MG tablet   Other Relevant Orders   CBC with Differential/Platelet   Comprehensive metabolic panel   Lipid panel   VF (ventricular fibrillation) (HCC)    History of V-fib with cardiac arrest in 2009.  He has an ICD in place.  He states that he has never had his ICD fire since getting it implanted.  He is currently taking metoprolol 25 mg twice daily and aspirin 81 mg daily.  He is overdue for an appointment with cardiology, encouraged him to schedule this as soon as possible.  Continue collaboration recommendations from cardiology.      Relevant Medications  metoprolol tartrate (LOPRESSOR) 25 MG tablet     Other   History of cardiac arrest    History of cardiac arrest due to V-fib in 2009.  He has an ICD in place.  He is overdue for an appointment with cardiology, encouraged him to schedule this as soon as possible.      Prediabetes    Chronic, stable.  Check A1c today and treat based on results      Relevant Orders   Hemoglobin A1c   Routine general medical examination at a health care facility - Primary    Health maintenance reviewed and updated. Discussed nutrition, exercise. Check CMP, CBC today. Follow-up 1 year.          IMMUNIZATIONS:   - Tdap: Tetanus vaccination status reviewed: declined today. - Influenza: Postponed to flu season - Pneumovax: Not applicable - Prevnar: Not applicable - HPV: Not applicable - Zostavax vaccine: Not  applicable  SCREENING: - Colonoscopy:  has cologuard kit at home   Discussed with patient purpose of the colonoscopy is to detect colon cancer at curable precancerous or early stages   - AAA Screening: Not applicable  -Hearing Test: Not applicable  -Spirometry: Not applicable   PATIENT COUNSELING:    Sexuality: Discussed sexually transmitted diseases, partner selection, use of condoms, avoidance of unintended pregnancy  and contraceptive alternatives.   Advised to avoid cigarette smoking.  I discussed with the patient that most people either abstain from alcohol or drink within safe limits (<=14/week and <=4 drinks/occasion for males, <=7/weeks and <= 3 drinks/occasion for females) and that the risk for alcohol disorders and other health effects rises proportionally with the number of drinks per week and how often a drinker exceeds daily limits.  Discussed cessation/primary prevention of drug use and availability of treatment for abuse.   Diet: Encouraged to adjust caloric intake to maintain  or achieve ideal body weight, to reduce intake of dietary saturated fat and total fat, to limit sodium intake by avoiding high sodium foods and not adding table salt, and to maintain adequate dietary potassium and calcium preferably from fresh fruits, vegetables, and low-fat dairy products.    stressed the importance of regular exercise  Injury prevention: Discussed safety belts, safety helmets, smoke detector, smoking near bedding or upholstery.   Dental health: Discussed importance of regular tooth brushing, flossing, and dental visits.   Follow up plan: NEXT PREVENTATIVE PHYSICAL DUE IN 1 YEAR. Return in about 1 year (around 09/25/2023) for CPE.

## 2022-09-25 ENCOUNTER — Encounter: Payer: Self-pay | Admitting: Nurse Practitioner

## 2022-09-25 ENCOUNTER — Encounter: Payer: Self-pay | Admitting: Internal Medicine

## 2022-09-25 ENCOUNTER — Ambulatory Visit (INDEPENDENT_AMBULATORY_CARE_PROVIDER_SITE_OTHER): Payer: BC Managed Care – PPO | Admitting: Nurse Practitioner

## 2022-09-25 VITALS — BP 144/90 | HR 62 | Temp 97.5°F | Ht 68.0 in | Wt 179.0 lb

## 2022-09-25 DIAGNOSIS — R7303 Prediabetes: Secondary | ICD-10-CM

## 2022-09-25 DIAGNOSIS — I4901 Ventricular fibrillation: Secondary | ICD-10-CM

## 2022-09-25 DIAGNOSIS — I10 Essential (primary) hypertension: Secondary | ICD-10-CM

## 2022-09-25 DIAGNOSIS — Z8674 Personal history of sudden cardiac arrest: Secondary | ICD-10-CM

## 2022-09-25 DIAGNOSIS — Z0000 Encounter for general adult medical examination without abnormal findings: Secondary | ICD-10-CM | POA: Insufficient documentation

## 2022-09-25 LAB — CBC WITH DIFFERENTIAL/PLATELET
Basophils Absolute: 0 10*3/uL (ref 0.0–0.1)
Basophils Relative: 0.9 % (ref 0.0–3.0)
Eosinophils Absolute: 0.1 10*3/uL (ref 0.0–0.7)
Eosinophils Relative: 1 % (ref 0.0–5.0)
HCT: 47 % (ref 39.0–52.0)
Hemoglobin: 15.8 g/dL (ref 13.0–17.0)
Lymphocytes Relative: 22.5 % (ref 12.0–46.0)
Lymphs Abs: 1.1 10*3/uL (ref 0.7–4.0)
MCHC: 33.7 g/dL (ref 30.0–36.0)
MCV: 90.2 fl (ref 78.0–100.0)
Monocytes Absolute: 0.4 10*3/uL (ref 0.1–1.0)
Monocytes Relative: 8.5 % (ref 3.0–12.0)
Neutro Abs: 3.3 10*3/uL (ref 1.4–7.7)
Neutrophils Relative %: 67.1 % (ref 43.0–77.0)
Platelets: 128 10*3/uL — ABNORMAL LOW (ref 150.0–400.0)
RBC: 5.21 Mil/uL (ref 4.22–5.81)
RDW: 13.9 % (ref 11.5–15.5)
WBC: 5 10*3/uL (ref 4.0–10.5)

## 2022-09-25 LAB — COMPREHENSIVE METABOLIC PANEL
ALT: 12 U/L (ref 0–53)
AST: 14 U/L (ref 0–37)
Albumin: 4.9 g/dL (ref 3.5–5.2)
Alkaline Phosphatase: 74 U/L (ref 39–117)
BUN: 21 mg/dL (ref 6–23)
CO2: 30 mEq/L (ref 19–32)
Calcium: 10 mg/dL (ref 8.4–10.5)
Chloride: 101 mEq/L (ref 96–112)
Creatinine, Ser: 1.01 mg/dL (ref 0.40–1.50)
GFR: 88.23 mL/min (ref >60.00)
Glucose, Bld: 91 mg/dL (ref 70–99)
Potassium: 5 mEq/L (ref 3.5–5.1)
Sodium: 140 mEq/L (ref 135–145)
Total Bilirubin: 0.7 mg/dL (ref 0.2–1.2)
Total Protein: 7.5 g/dL (ref 6.0–8.3)

## 2022-09-25 LAB — LIPID PANEL
Cholesterol: 148 mg/dL (ref 0–200)
HDL: 33.8 mg/dL — ABNORMAL LOW (ref >39.00)
LDL Cholesterol: 97 mg/dL (ref 0–99)
NonHDL: 114.14
Total CHOL/HDL Ratio: 4
Triglycerides: 87 mg/dL (ref 0.0–149.0)
VLDL: 17.4 mg/dL (ref 0.0–40.0)

## 2022-09-25 LAB — HEMOGLOBIN A1C: Hgb A1c MFr Bld: 5.7 % (ref 4.6–6.5)

## 2022-09-25 MED ORDER — METOPROLOL TARTRATE 25 MG PO TABS: 25.0000 mg | ORAL_TABLET | Freq: Two times a day (BID) | ORAL | 0 refills | Status: DC

## 2022-09-25 MED ORDER — METOPROLOL TARTRATE 25 MG PO TABS: 25.0000 mg | ORAL_TABLET | Freq: Two times a day (BID) | ORAL | 0 refills | Status: AC

## 2022-09-25 NOTE — Assessment & Plan Note (Signed)
Chronic, not controlled.  He has been out of his metoprolol for the last week.  Will give him a refill of metoprolol 25 mg twice a day for 30 days as he needs to schedule an appointment with cardiology as soon as possible.  Check CMP, CBC, lipid panel today.  Follow-up 1 year

## 2022-09-25 NOTE — Assessment & Plan Note (Signed)
History of cardiac arrest due to V-fib in 2009.  He has an ICD in place.  He is overdue for an appointment with cardiology, encouraged him to schedule this as soon as possible.

## 2022-09-25 NOTE — Patient Instructions (Signed)
It was great to see you!  We are checking your labs today and will let you know the results via mychart/phone.   I have refilled 30 days of your metoprolol. Schedule an appointment with cardiology.   Let's follow-up in 1 year, sooner if you have concerns.  If a referral was placed today, you will be contacted for an appointment. Please note that routine referrals can sometimes take up to 3-4 weeks to process. Please call our office if you haven't heard anything after this time frame.  Take care,  Rodman Pickle, NP

## 2022-09-25 NOTE — Assessment & Plan Note (Signed)
Health maintenance reviewed and updated. Discussed nutrition, exercise. Check CMP, CBC today. Follow-up 1 year.   

## 2022-09-25 NOTE — Assessment & Plan Note (Signed)
History of V-fib with cardiac arrest in 2009.  He has an ICD in place.  He states that he has never had his ICD fire since getting it implanted.  He is currently taking metoprolol 25 mg twice daily and aspirin 81 mg daily.  He is overdue for an appointment with cardiology, encouraged him to schedule this as soon as possible.  Continue collaboration recommendations from cardiology.

## 2022-09-25 NOTE — Assessment & Plan Note (Signed)
Chronic, stable. Check A1c today and treat based on results. 

## 2022-11-20 ENCOUNTER — Ambulatory Visit (INDEPENDENT_AMBULATORY_CARE_PROVIDER_SITE_OTHER): Payer: BC Managed Care – PPO

## 2022-11-20 DIAGNOSIS — I469 Cardiac arrest, cause unspecified: Secondary | ICD-10-CM | POA: Diagnosis not present

## 2022-11-20 LAB — CUP PACEART REMOTE DEVICE CHECK
Battery Remaining Longevity: 50 mo
Battery Remaining Percentage: 48 %
Battery Voltage: 2.93 V
Brady Statistic RV Percent Paced: 1 %
Date Time Interrogation Session: 20240704020016
HighPow Impedance: 63 Ohm
HighPow Impedance: 63 Ohm
Implantable Lead Connection Status: 753985
Implantable Lead Implant Date: 20210106
Implantable Lead Location: 753860
Implantable Lead Model: 7122
Implantable Pulse Generator Implant Date: 20180329
Lead Channel Impedance Value: 410 Ohm
Lead Channel Pacing Threshold Amplitude: 1.25 V
Lead Channel Pacing Threshold Pulse Width: 0.4 ms
Lead Channel Sensing Intrinsic Amplitude: 12 mV
Lead Channel Setting Pacing Amplitude: 2.5 V
Lead Channel Setting Pacing Pulse Width: 0.4 ms
Lead Channel Setting Sensing Sensitivity: 0.5 mV
Pulse Gen Serial Number: 7410740
Zone Setting Status: 755011

## 2022-12-04 NOTE — Progress Notes (Signed)
Remote ICD transmission.   

## 2023-02-18 LAB — CUP PACEART REMOTE DEVICE CHECK
Battery Remaining Longevity: 48 mo
Battery Remaining Percentage: 45 %
Battery Voltage: 2.93 V
Brady Statistic RV Percent Paced: 1 %
Date Time Interrogation Session: 20241003031026
HighPow Impedance: 68 Ohm
HighPow Impedance: 68 Ohm
Implantable Lead Connection Status: 753985
Implantable Lead Implant Date: 20210106
Implantable Lead Location: 753860
Implantable Lead Model: 7122
Implantable Pulse Generator Implant Date: 20180329
Lead Channel Impedance Value: 410 Ohm
Lead Channel Pacing Threshold Amplitude: 1.25 V
Lead Channel Pacing Threshold Pulse Width: 0.4 ms
Lead Channel Sensing Intrinsic Amplitude: 12 mV
Lead Channel Setting Pacing Amplitude: 2.5 V
Lead Channel Setting Pacing Pulse Width: 0.4 ms
Lead Channel Setting Sensing Sensitivity: 0.5 mV
Pulse Gen Serial Number: 7410740
Zone Setting Status: 755011

## 2023-02-22 ENCOUNTER — Ambulatory Visit: Payer: BC Managed Care – PPO

## 2023-02-22 DIAGNOSIS — I469 Cardiac arrest, cause unspecified: Secondary | ICD-10-CM

## 2023-03-10 NOTE — Progress Notes (Signed)
Remote ICD transmission.   

## 2023-05-25 ENCOUNTER — Ambulatory Visit (INDEPENDENT_AMBULATORY_CARE_PROVIDER_SITE_OTHER): Payer: BC Managed Care – PPO

## 2023-05-25 DIAGNOSIS — I469 Cardiac arrest, cause unspecified: Secondary | ICD-10-CM | POA: Diagnosis not present

## 2023-05-26 LAB — CUP PACEART REMOTE DEVICE CHECK
Battery Remaining Longevity: 46 mo
Battery Remaining Percentage: 44 %
Battery Voltage: 2.93 V
Brady Statistic RV Percent Paced: 1 %
Date Time Interrogation Session: 20250107024511
HighPow Impedance: 62 Ohm
HighPow Impedance: 62 Ohm
Implantable Lead Connection Status: 753985
Implantable Lead Implant Date: 20210106
Implantable Lead Location: 753860
Implantable Lead Model: 7122
Implantable Pulse Generator Implant Date: 20180329
Lead Channel Impedance Value: 400 Ohm
Lead Channel Pacing Threshold Amplitude: 1.25 V
Lead Channel Pacing Threshold Pulse Width: 0.4 ms
Lead Channel Sensing Intrinsic Amplitude: 12 mV
Lead Channel Setting Pacing Amplitude: 2.5 V
Lead Channel Setting Pacing Pulse Width: 0.4 ms
Lead Channel Setting Sensing Sensitivity: 0.5 mV
Pulse Gen Serial Number: 7410740
Zone Setting Status: 755011

## 2023-07-07 NOTE — Progress Notes (Signed)
 Remote ICD transmission.

## 2023-07-07 NOTE — Addendum Note (Signed)
 Addended by: Geralyn Flash D on: 07/07/2023 12:09 PM   Modules accepted: Orders

## 2023-08-25 ENCOUNTER — Ambulatory Visit (INDEPENDENT_AMBULATORY_CARE_PROVIDER_SITE_OTHER): Payer: BC Managed Care – PPO

## 2023-08-25 DIAGNOSIS — I469 Cardiac arrest, cause unspecified: Secondary | ICD-10-CM | POA: Diagnosis not present

## 2023-08-25 LAB — CUP PACEART REMOTE DEVICE CHECK
Battery Remaining Longevity: 43 mo
Battery Remaining Percentage: 42 %
Battery Voltage: 2.92 V
Brady Statistic RV Percent Paced: 1 %
Date Time Interrogation Session: 20250408020030
HighPow Impedance: 65 Ohm
HighPow Impedance: 65 Ohm
Implantable Lead Connection Status: 753985
Implantable Lead Implant Date: 20210106
Implantable Lead Location: 753860
Implantable Lead Model: 7122
Implantable Pulse Generator Implant Date: 20180329
Lead Channel Impedance Value: 410 Ohm
Lead Channel Pacing Threshold Amplitude: 1.25 V
Lead Channel Pacing Threshold Pulse Width: 0.4 ms
Lead Channel Sensing Intrinsic Amplitude: 12 mV
Lead Channel Setting Pacing Amplitude: 2.5 V
Lead Channel Setting Pacing Pulse Width: 0.4 ms
Lead Channel Setting Sensing Sensitivity: 0.5 mV
Pulse Gen Serial Number: 7410740
Zone Setting Status: 755011

## 2023-08-31 ENCOUNTER — Encounter: Payer: Self-pay | Admitting: Internal Medicine

## 2023-10-08 NOTE — Progress Notes (Signed)
 Remote ICD transmission.

## 2023-11-25 ENCOUNTER — Ambulatory Visit: Payer: BC Managed Care – PPO

## 2023-11-25 DIAGNOSIS — I469 Cardiac arrest, cause unspecified: Secondary | ICD-10-CM

## 2023-11-26 LAB — CUP PACEART REMOTE DEVICE CHECK
Battery Remaining Longevity: 42 mo
Battery Remaining Percentage: 40 %
Battery Voltage: 2.92 V
Brady Statistic RV Percent Paced: 1 %
Date Time Interrogation Session: 20250710020014
HighPow Impedance: 57 Ohm
HighPow Impedance: 57 Ohm
Implantable Lead Connection Status: 753985
Implantable Lead Implant Date: 20210106
Implantable Lead Location: 753860
Implantable Lead Model: 7122
Implantable Pulse Generator Implant Date: 20180329
Lead Channel Impedance Value: 410 Ohm
Lead Channel Pacing Threshold Amplitude: 1.25 V
Lead Channel Pacing Threshold Pulse Width: 0.4 ms
Lead Channel Sensing Intrinsic Amplitude: 12 mV
Lead Channel Setting Pacing Amplitude: 2.5 V
Lead Channel Setting Pacing Pulse Width: 0.4 ms
Lead Channel Setting Sensing Sensitivity: 0.5 mV
Pulse Gen Serial Number: 7410740
Zone Setting Status: 755011

## 2023-11-29 ENCOUNTER — Ambulatory Visit: Payer: Self-pay | Admitting: Internal Medicine

## 2024-02-25 ENCOUNTER — Ambulatory Visit: Payer: BC Managed Care – PPO

## 2024-02-25 DIAGNOSIS — I469 Cardiac arrest, cause unspecified: Secondary | ICD-10-CM | POA: Diagnosis not present

## 2024-02-25 LAB — CUP PACEART REMOTE DEVICE CHECK
Battery Remaining Longevity: 40 mo
Battery Remaining Percentage: 38 %
Battery Voltage: 2.92 V
Brady Statistic RV Percent Paced: 1 %
Date Time Interrogation Session: 20251009020022
HighPow Impedance: 57 Ohm
HighPow Impedance: 57 Ohm
Implantable Lead Connection Status: 753985
Implantable Lead Implant Date: 20210106
Implantable Lead Location: 753860
Implantable Lead Model: 7122
Implantable Pulse Generator Implant Date: 20180329
Lead Channel Impedance Value: 400 Ohm
Lead Channel Pacing Threshold Amplitude: 1.25 V
Lead Channel Pacing Threshold Pulse Width: 0.4 ms
Lead Channel Sensing Intrinsic Amplitude: 12 mV
Lead Channel Setting Pacing Amplitude: 2.5 V
Lead Channel Setting Pacing Pulse Width: 0.4 ms
Lead Channel Setting Sensing Sensitivity: 0.5 mV
Pulse Gen Serial Number: 7410740
Zone Setting Status: 755011

## 2024-02-25 NOTE — Progress Notes (Signed)
 Remote ICD Transmission

## 2024-02-28 NOTE — Progress Notes (Signed)
 Remote ICD Transmission

## 2024-03-02 ENCOUNTER — Ambulatory Visit: Payer: Self-pay | Admitting: Internal Medicine

## 2024-05-26 ENCOUNTER — Ambulatory Visit: Payer: BC Managed Care – PPO

## 2024-05-26 DIAGNOSIS — I469 Cardiac arrest, cause unspecified: Secondary | ICD-10-CM

## 2024-05-29 LAB — CUP PACEART REMOTE DEVICE CHECK
Battery Remaining Longevity: 38 mo
Battery Remaining Percentage: 36 %
Battery Voltage: 2.9 V
Brady Statistic RV Percent Paced: 1 %
Date Time Interrogation Session: 20260109224307
HighPow Impedance: 62 Ohm
HighPow Impedance: 62 Ohm
Implantable Lead Connection Status: 753985
Implantable Lead Implant Date: 20210106
Implantable Lead Location: 753860
Implantable Lead Model: 7122
Implantable Pulse Generator Implant Date: 20180329
Lead Channel Impedance Value: 400 Ohm
Lead Channel Pacing Threshold Amplitude: 1.25 V
Lead Channel Pacing Threshold Pulse Width: 0.4 ms
Lead Channel Sensing Intrinsic Amplitude: 12 mV
Lead Channel Setting Pacing Amplitude: 2.5 V
Lead Channel Setting Pacing Pulse Width: 0.4 ms
Lead Channel Setting Sensing Sensitivity: 0.5 mV
Pulse Gen Serial Number: 7410740
Zone Setting Status: 755011

## 2024-05-30 ENCOUNTER — Ambulatory Visit: Payer: Self-pay | Admitting: Cardiology

## 2024-05-30 NOTE — Progress Notes (Signed)
 Remote ICD Transmission
